# Patient Record
Sex: Male | Born: 1955 | Race: White | Hispanic: No | Marital: Married | Smoking: Former smoker
Health system: Southern US, Community
[De-identification: ages and names within clinical notes are randomized; demographics above are authoritative.]

## PROBLEM LIST (undated history)

## (undated) DIAGNOSIS — F329 Major depressive disorder, single episode, unspecified: Secondary | ICD-10-CM

## (undated) DIAGNOSIS — F32A Depression, unspecified: Secondary | ICD-10-CM

## (undated) DIAGNOSIS — H9190 Unspecified hearing loss, unspecified ear: Secondary | ICD-10-CM

## (undated) DIAGNOSIS — M459 Ankylosing spondylitis of unspecified sites in spine: Secondary | ICD-10-CM

## (undated) DIAGNOSIS — I10 Essential (primary) hypertension: Secondary | ICD-10-CM

## (undated) DIAGNOSIS — R7303 Prediabetes: Secondary | ICD-10-CM

## (undated) DIAGNOSIS — E876 Hypokalemia: Secondary | ICD-10-CM

## (undated) DIAGNOSIS — M069 Rheumatoid arthritis, unspecified: Secondary | ICD-10-CM

## (undated) DIAGNOSIS — K219 Gastro-esophageal reflux disease without esophagitis: Secondary | ICD-10-CM

## (undated) DIAGNOSIS — H547 Unspecified visual loss: Secondary | ICD-10-CM

## (undated) DIAGNOSIS — F1021 Alcohol dependence, in remission: Secondary | ICD-10-CM

## (undated) HISTORY — DX: Alcohol dependence, in remission: F10.21

## (undated) HISTORY — DX: Prediabetes: R73.03

## (undated) HISTORY — DX: Major depressive disorder, single episode, unspecified: F32.9

## (undated) HISTORY — DX: Hypokalemia: E87.6

## (undated) HISTORY — DX: Ankylosing spondylitis of unspecified sites in spine: M45.9

## (undated) HISTORY — DX: Depression, unspecified: F32.A

## (undated) HISTORY — DX: Unspecified hearing loss, unspecified ear: H91.90

## (undated) HISTORY — DX: Rheumatoid arthritis, unspecified: M06.9

## (undated) HISTORY — DX: Unspecified visual loss: H54.7

## (undated) HISTORY — DX: Essential (primary) hypertension: I10

---

## 2004-07-02 ENCOUNTER — Ambulatory Visit: Payer: Self-pay | Admitting: Psychology

## 2004-07-08 ENCOUNTER — Ambulatory Visit: Payer: Self-pay | Admitting: Psychology

## 2004-07-16 ENCOUNTER — Ambulatory Visit: Payer: Self-pay | Admitting: Psychology

## 2004-07-24 ENCOUNTER — Ambulatory Visit: Payer: Self-pay | Admitting: Psychology

## 2004-07-30 ENCOUNTER — Ambulatory Visit: Payer: Self-pay | Admitting: Psychology

## 2004-08-05 ENCOUNTER — Ambulatory Visit: Payer: Self-pay | Admitting: Psychology

## 2004-08-07 ENCOUNTER — Ambulatory Visit: Payer: Self-pay | Admitting: Psychology

## 2004-08-28 ENCOUNTER — Ambulatory Visit: Payer: Self-pay | Admitting: Psychology

## 2004-09-03 ENCOUNTER — Ambulatory Visit: Payer: Self-pay | Admitting: Psychology

## 2004-09-17 ENCOUNTER — Ambulatory Visit: Payer: Self-pay | Admitting: Psychology

## 2004-10-10 ENCOUNTER — Ambulatory Visit: Payer: Self-pay | Admitting: Psychology

## 2004-10-15 ENCOUNTER — Ambulatory Visit: Payer: Self-pay | Admitting: Psychology

## 2004-10-29 ENCOUNTER — Ambulatory Visit: Payer: Self-pay | Admitting: Psychology

## 2004-11-12 ENCOUNTER — Ambulatory Visit: Payer: Self-pay | Admitting: Psychology

## 2004-11-26 ENCOUNTER — Ambulatory Visit: Payer: Self-pay | Admitting: Psychology

## 2004-12-10 ENCOUNTER — Ambulatory Visit: Payer: Self-pay | Admitting: Psychology

## 2004-12-24 ENCOUNTER — Ambulatory Visit: Payer: Self-pay | Admitting: Psychology

## 2005-01-21 ENCOUNTER — Ambulatory Visit: Payer: Self-pay | Admitting: Psychology

## 2005-03-18 ENCOUNTER — Ambulatory Visit: Payer: Self-pay | Admitting: Psychology

## 2005-04-01 ENCOUNTER — Ambulatory Visit: Payer: Self-pay | Admitting: Psychology

## 2005-04-29 ENCOUNTER — Ambulatory Visit: Payer: Self-pay | Admitting: Psychology

## 2005-05-27 ENCOUNTER — Ambulatory Visit: Payer: Self-pay | Admitting: Psychology

## 2005-06-10 ENCOUNTER — Ambulatory Visit: Payer: Self-pay | Admitting: Psychology

## 2005-07-08 ENCOUNTER — Ambulatory Visit: Payer: Self-pay | Admitting: Psychology

## 2005-07-22 ENCOUNTER — Ambulatory Visit: Payer: Self-pay | Admitting: Psychology

## 2005-08-19 ENCOUNTER — Ambulatory Visit: Payer: Self-pay | Admitting: Psychology

## 2005-09-30 ENCOUNTER — Ambulatory Visit: Payer: Self-pay | Admitting: Psychology

## 2005-10-14 ENCOUNTER — Ambulatory Visit: Payer: Self-pay | Admitting: Psychology

## 2005-11-06 ENCOUNTER — Ambulatory Visit: Payer: Self-pay | Admitting: Psychology

## 2005-11-25 ENCOUNTER — Ambulatory Visit: Payer: Self-pay | Admitting: Psychology

## 2005-12-01 ENCOUNTER — Ambulatory Visit: Payer: Self-pay | Admitting: Psychology

## 2005-12-09 ENCOUNTER — Ambulatory Visit: Payer: Self-pay | Admitting: Psychology

## 2005-12-23 ENCOUNTER — Ambulatory Visit: Payer: Self-pay | Admitting: Psychology

## 2006-01-06 ENCOUNTER — Ambulatory Visit: Payer: Self-pay | Admitting: Psychology

## 2006-01-20 ENCOUNTER — Ambulatory Visit: Payer: Self-pay | Admitting: Psychology

## 2006-04-08 ENCOUNTER — Ambulatory Visit: Payer: Self-pay | Admitting: Psychology

## 2006-04-29 ENCOUNTER — Ambulatory Visit: Payer: Self-pay | Admitting: Psychology

## 2006-05-06 ENCOUNTER — Ambulatory Visit: Payer: Self-pay | Admitting: Psychology

## 2006-05-20 ENCOUNTER — Ambulatory Visit: Payer: Self-pay | Admitting: Psychology

## 2006-06-17 ENCOUNTER — Ambulatory Visit: Payer: Self-pay | Admitting: Psychology

## 2006-07-29 ENCOUNTER — Ambulatory Visit: Payer: Self-pay | Admitting: Psychology

## 2006-08-04 ENCOUNTER — Ambulatory Visit: Payer: Self-pay | Admitting: Psychology

## 2006-08-12 ENCOUNTER — Ambulatory Visit: Payer: Self-pay | Admitting: Psychology

## 2006-08-27 ENCOUNTER — Ambulatory Visit: Payer: Self-pay | Admitting: Psychology

## 2006-09-10 ENCOUNTER — Ambulatory Visit: Payer: Self-pay | Admitting: Psychology

## 2006-11-04 ENCOUNTER — Ambulatory Visit: Payer: Self-pay | Admitting: Psychology

## 2006-11-11 ENCOUNTER — Encounter: Payer: Self-pay | Admitting: Emergency Medicine

## 2006-11-12 ENCOUNTER — Inpatient Hospital Stay (HOSPITAL_COMMUNITY): Admission: EM | Admit: 2006-11-12 | Discharge: 2006-11-14 | Payer: Self-pay | Admitting: Internal Medicine

## 2007-06-14 ENCOUNTER — Telehealth: Payer: Self-pay | Admitting: Gastroenterology

## 2007-06-15 ENCOUNTER — Ambulatory Visit: Payer: Self-pay | Admitting: Gastroenterology

## 2007-06-25 ENCOUNTER — Ambulatory Visit: Payer: Self-pay | Admitting: Gastroenterology

## 2008-12-28 ENCOUNTER — Ambulatory Visit: Payer: Self-pay | Admitting: Gastroenterology

## 2009-01-24 ENCOUNTER — Ambulatory Visit: Payer: Self-pay | Admitting: Gastroenterology

## 2010-03-19 NOTE — Procedures (Signed)
Summary: Flexible Sigmoidoscopy  Patient: Barth Trella Note: All result statuses are Final unless otherwise noted.  Tests: (1) Flexible Sigmoidoscopy (FLX)  FLX Flexible Sigmoidoscopy                             DONE     [facility]     [address]     [place], [place]  [address]           FLEXIBLE SIGMOIDOSCOPY PROCEDURE REPORT           PATIENT:  Michaelanthony, Kempton  MR#:  [id number]     BIRTHDATE:  1955-10-11, 53 yrs. old  GENDER:  male     ENDOSCOPIST:  Rachael Fee, MD     [facility] DATE:  01/24/2009     PROCEDURE:  Flexible Sigmoidoscopy with biopsy     ASA CLASS:  Class II     INDICATIONS:  intermittent rectal bleeding for past 6 months (full     colonoscopy in 2009, no polyps, recall at 10 year interval     MEDICATIONS:   none           DESCRIPTION OF PROCEDURE:   After the risks benefits and     alternatives of the procedure were thoroughly explained, informed     consent was obtained.  Digital rectal exam was performed and     revealed no rectal masses.   The [id number] endoscope     was introduced through the anus and advanced to the splenic     flexure, without limitations.  The quality of the prep was good.     The instrument was then slowly withdrawn as the mucosa was fully     examined.     <<PROCEDUREIMAGES>>           There were 3-4 thin linear erosions in rectum. Underlying rectal     mucosa was slightly erythematous but not overtly inflammed     otherwise. Biopsies taken to check for chronic inflammation (see     image1, image3, and image4).  Internal hemorrhoids were found.     These were small and not thrombosed. No fissures noted (see     image5).  The examination was otherwise normal (see image2).     Retroflexed views in the rectum revealed no abnormalities.    The     scope was then withdrawn from the patient and the procedure     terminated.     COMPLICATIONS:  None           ENDOSCOPIC IMPRESSION:     1) Several thin linear  erosions in rectum (chronic, mild     proctitis?), biopsied.     2) Internal hemorrhoids, small and not thrombosed.     3) Otherwise normal examination.  No cancer, no fissures.           RECOMMENDATIONS:     If chronic inflammation is noted on biopsies, will start     mesalamine suppositories for very limited proctitis.  If no     chronic inflammation changes are noted, will treat as symptomatic     (bleeding, uncomfortble) internal hemorrhoids.           ______________________________     Rachael Fee, MD           n.     eSIGNED:   Rachael Fee at 01/24/2009 11:41 AM  [place], [place], [id number]  Note: An exclamation mark (!) indicates a result that was not dispersed into the flowsheet. Document Creation Date: 01/24/2009 11:40 AM _______________________________________________________________________  (1) Order result status: Final Collection or observation date-time: 01/24/2009 11:32 Requested date-time:  Receipt date-time:  Reported date-time:  Referring Physician:   Ordering Physician: Rob Bunting [phone]) Specimen Source:  Source: Launa Grill Order Number: [id number] Lab site:

## 2010-03-19 NOTE — Procedures (Signed)
Summary: Colonoscopy   Colonoscopy  Procedure date:  06/25/2007  Findings:      Location:  [facility].    Procedures Next Due Date:    Colonoscopy: 06/2017  Patient Name: George Higgins, George Higgins MRN:  Procedure Procedures: Colonoscopy CPT: 69629.  Personnel: Endoscopist: Rachael Fee, MD.  Exam Location: Exam performed in Endoscopy Suite. Outpatient  Patient Consent: Procedure, Alternatives, Risks and Benefits discussed, consent obtained, from patient. Consent was obtained by the RN.  Indications  Average Risk Screening Routine.  Comments: years of mild fecal incontinence following a BM (has to clean self a second time, usually after every BM) History  Current Medications: Patient is not currently taking Coumadin.  Comments: Patient history reviewed and updated, pre-procedure physical performed prior to initiation of sedation? yes Pre-Exam Physical: Performed Jun 25, 2007. Cardio-pulmonary exam, Abdominal exam, Mental status exam WNL.  Exam Exam: Extent of exam reached: Terminal Ileum, extent intended: Terminal Ileum.  The cecum was identified by appendiceal orifice and IC valve. Patient position: on left side. Time to Cecum: 00:02:35. Time for Withdrawl: 00:07:46. Colon retroflexion performed. Images taken. ASA Classification: II. Tolerance: good.  Monitoring: Pulse and BP monitoring, Oximetry used. Supplemental O2 given.  Colon Prep Prep results: good.  Sedation Meds: Patient assessed and found to be appropriate for moderate (conscious) sedation. Fentanyl 75 mcg. given IV. Versed 7 mg. given IV.  Findings NORMAL EXAM: Ileum.  - NORMAL EXAM: Cecum to Rectum. Comments: OTHERWISE NORMAL EXAMINATION.  OTHER FINDING: Anus. Comments: SLIGHTLY IRRITATED PERIANAL SKIN (2- 3CM AROUND ANUS).   Assessment Abnormal examination, see findings above.  Comments: SLIGHT PERIANAL SKIN IRRITATION, OTHERWISE THE EXAMINATION WAS NORMAL TO THE TERMINAL ILEUM.   NO POYLPS, NO CANCERS.  HE IS AT ROUTINE RISK FOR COLON CANCER AND SO SHOULD CONTINUE TO FOLLOW CURRENT COLORECTAL CANCER SCREENING GUIDELINES WITH A REPEAT COLONOSCOPY IN 10 YEARS.  HE WILL REPLACE HIS CURRENT CREAM WITH DESITIN CREAM, APPLIED ONCE DAILY TO SEE IF THE IRRITATION IMPROVES.  HE WILL TRY DAILY FIBER SUPPLEMENT (ORANGE POWDER CITRUCEL) TO ATTEMPT TO BULK STOOL AND WILL GET IN TOUCH WITH ME WITH THE RESULTS.  CHERYL (MY RN, DIRECT NUMBER [phone]) OR ME, PAGER [phone]. Events  Unplanned Interventions: No intervention was required.  Unplanned Events: There were no complications. Plans Comments: COLONOSCOPY IN 10 YEARS   This report was created from the original endoscopy report, which was reviewed and signed the above listed endoscopist.

## 2010-03-19 NOTE — Progress Notes (Signed)
Summary: returning nurse call, needs to schedule endo/colon  Phone Note Call from Patient Call back at Work Phone [phone]   Call For: DR JACOBS Reason for Call: Talk to Nurse Summary of Call: Returning call from Dr Christella Hartigan nurse from friday 4/24, please call pt back [phone] to schedule endo & colon;no chart Initial call taken by: Verdell Face,  June 14, 2007 9:43 AM  Follow-up for Phone Call        pt scheduled  for ecl on 06/25/07 at [facility]/given pv on 06/15/07 at 4:30p.m. Follow-up by: Teryl Lucy RN,  June 14, 2007 11:48 AM

## 2010-03-19 NOTE — Miscellaneous (Signed)
Summary: [facility] Previsit/prep  Clinical Lists Changes  Observations: Added new observation of NKA: T (12/28/2008 11:07)

## 2010-03-19 NOTE — Procedures (Signed)
Summary: EGD   EGD  Procedure date:  06/25/2007  Findings:      Location: [facility]    Patient Name: George Higgins, George Higgins MRN:  Procedure Procedures: Panendoscopy (EGD) CPT: 43235.  Personnel: Endoscopist: Rachael Fee, MD.  Exam Location: Exam performed in Endoscopy Suite. Outpatient  Patient Consent: Procedure, Alternatives, Risks and Benefits discussed, consent obtained, from patient. Consent was obtained by the RN.  Indications Symptoms: Reflux symptoms  History  Current Medications: Patient is not currently taking Coumadin.  Comments: Patient history reviewed and updated, pre-procedure physical performed prior to initiation of sedation? YES Pre-Exam Physical: Performed Jun 25, 2007  Cardio-pulmonary exam, Abdominal exam, Mental status exam WNL.  Exam Exam Info: Maximum depth of insertion Duodenum, intended Duodenum. Patient position: on left side. Gastric retroflexion performed. Images taken. ASA Classification: II. Tolerance: good.  Sedation Meds: Patient assessed and found to be appropriate for moderate (conscious) sedation. Fentanyl 25 mcg. given IV. Versed 3 mg. given IV. Cetacaine Spray 2 sprays given aerosolized.  Monitoring: BP and pulse monitoring done. Oximetry used. Supplemental O2 given  Findings - Normal: Proximal Esophagus to Duodenal 2nd Portion.   Assessment Normal examination.  Comments: CHRONIC GERD SYMPTOMS, HOWEVER NO ESOPHAGEAL DAMAGE FROM GERD (NO BARRETT'S, NO ESOPHAGITIS, NO PEPTIC STRICTURING).  HIS SYMPTOMS ARE JUST BARELY CONTROLLED ON ONCE DAILY H2 BLOCKER.  HE KNOWS THAT IF THIS DOESN'T CONTINUE TO HELP HE SHOULD RETRY PPI (OVER THE COUNTER PRILOSEC) AND THAT IT IS BETTER TO TAKE THIS CLASS OF MEDICINES 20- 30 MIN PRIOR TO A DECENT MEAL (USUALLY BREAKFAST).  HE WILL CONTACT ME OR MY OFFICE WITH FURTHER QUESTIONS OR CONCERNS. Events  Unplanned Intervention: No unplanned interventions were required.  Unplanned  Events: There were no complications.    This report was created from the original endoscopy report, which was reviewed and signed the above listed endoscopist.

## 2010-03-19 NOTE — Letter (Signed)
Summary: [facility]  [address] [place], [place] [address]   Phone: [phone]  Fax: [phone]       George Higgins    1955-12-16    MRN: [id number]        Procedure Day Dorna Bloom:  Wednesday 01/24/09     Arrival Time:  10:30a.m.      Procedure Time:  11:30a.m.     Location of Procedure:                    _ X_  [facility] (4th Floor) _ _  [facility] ( Outpatient Registration)    _ _  [facility] ( Short Stay on [facility])   PREPARATION FOR FLEXIBLE SIGMOIDOSCOPY WITH MAGNESIUM CITRATE  Prior to the day before your procedure, purchase one 8 oz. bottle of Magnesium Citrate and one Fleet Enema from the laxative section of your drugstore.  _________________________________________________________________________________________________  THE DAY BEFORE YOUR PROCEDURE             DATE: 01/23/09      DAY: Tuesday  1.   Have a clear liquid dinner the night before your procedure.  2.   Do not drink anything colored red or purple.  Avoid juices with pulp.  No orange juice.              CLEAR LIQUIDS INCLUDE: Water Jello Ice Popsicles Tea (sugar ok, no milk/cream) Powdered fruit flavored drinks Coffee (sugar ok, no milk/cream) Gatorade Juice: apple, white grape, white cranberry  Lemonade Clear bullion, consomm, broth Carbonated beverages (any kind) Strained chicken noodle soup Hard Candy   3.   At 7:00 pm the night before your procedure, drink one bottle of Magnesium Citrate over ice.  4.   Drink at least 3 more glasses of clear liquids before bedtime (preferably juices).  5.   Results are expected usually within 1 to 6 hours after taking the Magnesium Citrate.  ___________________________________________________________________________________________________  THE DAY OF YOUR PROCEDURE            DATE: 01/24/09     DAY: Wednesday  1.   Use Fleet Enema one hour prior to coming for procedure.  2.    You may drink clear liquids until  9:30a.m. (2 hours before exam)       MEDICATION INSTRUCTIONS  Unless otherwise instructed, you should take regular prescription medications with a small sip of water as early as possible the morning of your procedure.  Diabetic patients - see separate instructions.  Stop taking  Plavix or Aggrenox on  _  _  (7 days before procedure).     Stop taking Coumadin on  _  _  (5 days before procedure).  Additional medication instructions:  Hold Tenoretic the morning of procedure.         OTHER INSTRUCTIONS  You will need a responsible adult at least 56 years of age to accompany you and drive you home.   This person must remain in the waiting room during your procedure.  Wear loose fitting clothing that is easily removed.  Leave jewelry and other valuables at home.  However, you may wish to bring a book to read or an iPod/MP3 player to listen to music as you wait for your procedure to start.  Remove all body piercing jewelry and leave at home.  Total time from sign-in until discharge is approximately 2-3 hours.  You should go home directly after your  procedure and rest.  You can resume normal activities the day after your procedure.  The day of your procedure you should not:   Drive   Make legal decisions   Operate machinery   Drink alcohol   Return to work  You will receive specific instructions about eating, activities and medications before you leave.   The above instructions have been reviewed and explained to me by   _______________________    I fully understand and can verbalize these instructions _____________________________ Date _________

## 2010-03-19 NOTE — Miscellaneous (Signed)
Summary: GI Previsit  Clinical Lists Changes  Medications: Added new medication of MOVIPREP 100 GM  SOLR (PEG-KCL-NACL-NASULF-NA ASC-C) As per prep instructions. - Signed Rx of MOVIPREP 100 GM  SOLR (PEG-KCL-NACL-NASULF-NA ASC-C) As per prep instructions.;  #1 x 0;  Signed;  Entered by: Eual Fines RN;  Authorized by: Rachael Fee MD;  Method used: Electronic Observations: Added new observation of NKA: T (06/15/2007 17:06)    Prescriptions: MOVIPREP 100 GM  SOLR (PEG-KCL-NACL-NASULF-NA ASC-C) As per prep instructions.  #1 x 0   Entered by:   Eual Fines RN   Authorized by:   Rachael Fee MD   Signed by:   Eual Fines RN on 06/15/2007   Method used:   Electronically sent to ...       [facility]  [place] [address]       [address]       [place], [place]  [address]       Ph: [phone] or [phone]       Fax: [phone]   RxID:   [id number]

## 2010-07-02 NOTE — H&P (Signed)
NAME:  George Higgins, George Higgins NO.:  [id number]   MEDICAL RECORD NO.:  [id number]          PATIENT TYPE:  EMS   LOCATION:  ED                           FACILITY:  [facility]   PHYSICIAN:  Hillery Aldo, M.D.   DATE OF BIRTH:  February 11, 1956   DATE OF ADMISSION:  11/11/2006  DATE OF DISCHARGE:                              HISTORY & PHYSICAL   PRIMARY CARE PHYSICIAN:  Dr. Raquel James with [facility]  please send a copy of this admission H&P to Dr. Christena Flake office.   CHIEF COMPLAINT:  Altered mental status.   HISTORY OF PRESENT ILLNESS:  This is a 56 year old male with past  medical history of alcohol dependency, who was planning to go to a  rehabilitation program on November 12, 2006.  According to the  patient's wife, he has been on a drinking binge for the past three days.  He has been living separately from his wife in an apartment and she  found him tonight drinking vodka and requesting to go to rehabilitation.  She transported him by a private vehicle to the emergency department and  he was subsequently unable to ambulate by himself into the hospital.  He  is being admitted for acute detoxification and stabilization.   PAST MEDICAL HISTORY:  1. Alcohol dependency.  2. Depression.  3. Dyslipidemia.  4. Hypertension.   FAMILY HISTORY:  The patient's father died in his 61s from an MI.  Mother is alive in her 17s and has diabetes.  He has one brother who  committed suicide and also had alcohol dependency.  He has another  brother who is hypertensive but healthy.   SOCIAL HISTORY:  The patient is married but recently separated.  He has  an anesthesiologist.  He has a history of heavy alcohol use with binge  drinking.  No drug abuse.   ALLERGIES:  NO KNOWN DRUG ALLERGIES.   MEDICATIONS:  1. Lexapro 20 mg daily.  2. Nexium 40 mg daily.  3. Tenoretic 100/25 daily.   REVIEW OF SYSTEMS:  The patient reports that he is not currently  nauseated and has not  had any vomiting.  He has had some headache.  According to his wife, he has been complaining of dyspnea on exertion.  Limited review of systems due to altered mental status.   PHYSICAL EXAMINATION:  VITAL SIGNS:  Temperature 97.8, pulse 71,  respirations 17, blood pressure 118/66, O2 saturation 100% on 2 L.  GENERAL:  Well-developed, well-nourished male in no acute distress.  HEENT::  Normocephalic, atraumatic.  Pupils are dilated to 4 mm but  equally responsive to light.  Extraocular movements are intact.  Oropharynx is clear.  NECK:  Supple, no thyromegaly, no lymphadenopathy, no jugular venous  distention.  CHEST:  Lungs clear to auscultation bilaterally with good air movement.  HEART:  Regular rate and rhythm.  No murmurs, rubs or gallops.  ABDOMEN:  Abdomen:  Soft, nontender, nondistended with normoactive bowel  sounds.  EXTREMITIES:  No clubbing, edema, cyanosis.  SKIN:  Warm and dry.  No rashes.  NEUROLOGIC:  The patient is disoriented to place.  He  reorients easily  and is somewhat lethargic but responsive to stimuli.   DATA REVIEW:  1. A CT scan of the head shows mild atrophy and small vessel ischemic      changes.  No acute changes.  2. Laboratory data:  White blood cell count is 11.8, hemoglobin 15.4,      hematocrit 43.2, platelets 198,000 with an absolute neutrophil      count of 7.7.  Sodium is 135, potassium 2.5, chloride 91, bicarb      25, BUN 9, creatinine 1.01, glucose 107, magnesium 1.9, total      bilirubin 0.7, alkaline phosphatase 38, AST 45, ALT 55, total      protein 6.4, albumin 3.8, calcium 8.5.  No detectable acetaminophen      or salicylate.  Alcohol level was 430.   ASSESSMENT/PLAN:  1. Alcohol dependency with acute intoxication:  We will admit the      patient and monitor him closely on telemetry unit.  We will      administer Ativan p.r.n. for signs of withdrawal or agitation.  We      will put him on seizures precautions.  We will transfer him to  his      detoxification program when he is medically stable, likely      tomorrow.  2. Hypokalemia:  Replete the patient's potassium with a combination of      p.o. and IV potassium.  His magnesium level was normal.  3. History of hypertension:  Monitor the patient's blood pressure.      Given his hypokalemia, will hold the hydrochlorothiazide and      continue the atenolol if his blood pressure and pulse are greater      than 105 and 60 respectively.  4. Prophylaxis:  Use PAS hoses for DVT prophylaxis and put the patient      on Protonix for GI prophylaxis.      Hillery Aldo, M.D.  Electronically Signed     CR/MEDQ  D:  11/11/2006  T:  11/12/2006  Job:  [id number]   cc:   Ursula Beath, MD  Fax: [phone]

## 2010-07-02 NOTE — Discharge Summary (Signed)
NAME:  George Higgins, GERHOLD NO.:  [id number]   MEDICAL RECORD NO.:  [id number]          PATIENT TYPE:  INP   LOCATION:  4729                         FACILITY:  [facility]   PHYSICIAN:  Elliot Cousin, M.D.    DATE OF BIRTH:  [birth year]   DATE OF ADMISSION:  11/12/2006  DATE OF DISCHARGE:  11/14/2006                               DISCHARGE SUMMARY   DISCHARGE DIAGNOSES:  1. Acute alcohol intoxication.  2. Alcohol dependence/alcoholism.  3. Alcohol withdrawal symptoms.  4. Hypokalemia.  5. Hypertension.  6. Depression.  7. Mild hepatic transaminitis secondary to alcohol use.   DISCHARGE MEDICATIONS:  1. Ativan taper, take as directed.  2. Potassium chloride 20 mEq daily.  3. Anusol-HC, apply to affected area as needed.  4. Lexapro 20 mg daily.  5. Tenoretic 50//25 mg daily.  6. Nexium 40 mg daily.  7. Multivitamin daily.   DISCHARGE DISPOSITION:  The patient is currently stable and in improved  condition.  The plan is to discharge him to the [facility], where he will  receive inpatient rehabilitation for alcohol dependency.   CONSULTATIONS:  None   PROCEDURE PERFORMED:  CT scan of the head performed on November 11, 2006.  The results revealed mild premature atrophy without definite  acute intracranial findings.   HISTORY OF PRESENT ILLNESS:  The patient is a 55 year old man with a  past medical history significant for alcohol dependency, hypertension,  and depression.  He presented to the emergency department on November 11, 2006, with a desire to detox from alcohol abuse.  According to the  patient's wife, he had been drinking heavily for the past 3 days leading  up to hospital admission.  He had been living separately from his wife  in an apartment and apparently she found him drinking vodka and  requesting to go to rehabilitation.  When the patient arrived to the  emergency department, he was found to be slightly agitated and  intoxicated and requiring  restraints.  While in the emergency  department, his lab data were significant for an alcohol level of 430  and a serum potassium of 2.5.  His SGOT was noted to be 45 and his SGPT  was 55.  A CT scan of his head was ordered and revealed no acute  intracranial findings.  The patient was admitted for further evaluation  and management.   For additional details please see the dictated history and physical.   HOSPITAL COURSE:  Problem 1.  ALCOHOL DEPENDENCE/ALCOHOL WITHDRAWAL:  As indicated above,  the patient's serum alcohol level was elevated at 430.  An urine drug  screen was ordered for additional evaluation and the results were  negative.  Additionally, his acetaminophen level was less than 10.  The  patient was started on prophylactic treatment with oral Protonix 40 mg  daily.  He was started on IV fluid volume repletion with normal saline  with potassium chloride added.  Symptomatic treatment was started with  Phenergan and Zofran as needed.  To prevent withdrawal symptoms, he was  started on Ativan as needed initially.  However, on hospital day #2  the  patient appeared to be starting to withdraw and therefore the Ativan  protocol was started scheduled instead of p.r.n.  In addition, vitamin  therapy was started with thiamine 100 mg daily and a multivitamin with  iron.   Over the course of the hospitalization, the patient's withdrawal  symptoms completely resolved.  As of today he is currently alert and  oriented.  He has been cooperative  and insightful during the entire  hospitalization.  Per the conversation with the patient and the  patient's wife, the plan is to transfer him to inpatient rehabilitation  at the [facility].  The patient's family will pick him up  today and transport him directly to [facility].  The patient was given a  prescription for Ativan to be tapered over the next few days following  hospital discharge.  The taper is as follows:  Ativan 1 mg  b.i.d. to  complete the q 8 h. course today, then 1 mg b.i.d. tomorrow and then 1  mg once daily on the third day and then stop.  The patient was also  advised to take a multivitamin once daily.   Problem 2.  HYPOKALEMIA:  At the time of hospital admission, the  patient's serum potassium was low at 2.5.  A magnesium level was  assessed and found to be within normal limits at 1.9.  The patient was  repleted with potassium chloride in the IV fluids and orally.  Over the  course of the hospitalization, the hypokalemia completely resolved.  As  of today, his serum potassium is 4.1.  More than likely, the hypokalemia  was secondary to hydrochlorothiazide therapy and alcohol abuse.  The  patient will be maintained on 20 mEq of potassium chloride daily.   Problem 3.  MILD HEPATIC TRANSAMINITIS:  At the time of hospital  admission, the patient's SGOT was 45 and his SGPT was 55.  His total  bilirubin and alkaline phosphatase were within normal limits.  Over the  course of the hospitalization, his transaminitis increased slightly with  an SGOT of 67 and an SGPT of 67.  The patient has had no complaints of  abdominal pain, specifically no complaints of right upper quadrant  abdominal pain.  More than likely, the mild transaminitis is secondary  to alcohol use.   Problem 4.  HYPERTENSION:  The patient was maintained on atenolol  initially.  The hydrochlorothiazide was withheld initially because of  the hypokalemia.  However, once his blood pressure increased with a  diastolic pressure of greater than 100, the hydrochlorothiazide was  restarted.  As of today, his blood pressure is much improved, measuring  128/86.  The patient was advised to continue Tenoretic as previously  prescribed.  As indicated above, he was advised to take potassium  chloride daily.   DISCHARGE LABORATORIES:  Sodium 138, potassium 4.1, chloride 103, CO2  28, glucose 106, BUN 13, creatinine 1.04, total bilirubin 0.6,  alkaline  phosphatase 48, SGOT 67, SGPT 67, total protein 5.5, albumin 3.2,  calcium 9.0.  TSH 2.582.      Elliot Cousin, M.D.  Electronically Signed     DF/MEDQ  D:  11/14/2006  T:  11/14/2006  Job:  [id number]

## 2010-11-28 LAB — MAGNESIUM
Magnesium: 1.8
Magnesium: 1.9

## 2010-11-28 LAB — BASIC METABOLIC PANEL
BUN: 12
BUN: 14
BUN: 9
CO2: 27
CO2: 28
CO2: 30
Calcium: 8.1 — ABNORMAL LOW
Calcium: 8.5
Calcium: 8.9
Chloride: 100
Chloride: 106
Chloride: 98
Creatinine, Ser: 1.1
Creatinine, Ser: 1.14
Creatinine, Ser: 1.16
GFR calc Af Amer: >60
GFR calc Af Amer: >60
GFR calc Af Amer: >60
GFR calc non Af Amer: >60
GFR calc non Af Amer: >60
GFR calc non Af Amer: >60
Glucose, Bld: 131 — ABNORMAL HIGH
Glucose, Bld: 94
Glucose, Bld: 99
Potassium: 2.9 — ABNORMAL LOW
Potassium: 3.8
Potassium: 3.9
Sodium: 138
Sodium: 139
Sodium: 142

## 2010-11-28 LAB — HEPATIC FUNCTION PANEL
ALT: 51
AST: 47 — ABNORMAL HIGH
Albumin: 3.2 — ABNORMAL LOW
Alkaline Phosphatase: 39
Bilirubin, Direct: <0.1
Total Bilirubin: 0.9
Total Protein: 5.4 — ABNORMAL LOW

## 2010-11-28 LAB — COMPREHENSIVE METABOLIC PANEL
ALT: 55 — ABNORMAL HIGH
ALT: 67 — ABNORMAL HIGH
AST: 45 — ABNORMAL HIGH
AST: 67 — ABNORMAL HIGH
Albumin: 3.2 — ABNORMAL LOW
Albumin: 3.8
Alkaline Phosphatase: 38 — ABNORMAL LOW
Alkaline Phosphatase: 48
BUN: 13
BUN: 9
CO2: 25
CO2: 28
Calcium: 8.5
Calcium: 9
Chloride: 103
Chloride: 91 — ABNORMAL LOW
Creatinine, Ser: 1.01
Creatinine, Ser: 1.04
GFR calc Af Amer: >60
GFR calc Af Amer: >60
GFR calc non Af Amer: >60
GFR calc non Af Amer: >60
Glucose, Bld: 106 — ABNORMAL HIGH
Glucose, Bld: 107 — ABNORMAL HIGH
Potassium: 2.5 — CL
Potassium: 4.1
Sodium: 135
Sodium: 138
Total Bilirubin: 0.6
Total Bilirubin: 0.7
Total Protein: 5.5 — ABNORMAL LOW
Total Protein: 6.4

## 2010-11-28 LAB — CBC
HCT: 43.2
Hemoglobin: 15.4
MCHC: 35.5
MCV: 90.7
Platelets: 198
RBC: 4.77
RDW: 15.2 — ABNORMAL HIGH
WBC: 11.8 — ABNORMAL HIGH

## 2010-11-28 LAB — RAPID URINE DRUG SCREEN, HOSP PERFORMED
Amphetamines: NOT DETECTED
Barbiturates: NOT DETECTED
Benzodiazepines: NOT DETECTED
Cocaine: NOT DETECTED
Opiates: NOT DETECTED
Tetrahydrocannabinol: NOT DETECTED

## 2010-11-28 LAB — URINALYSIS, ROUTINE W REFLEX MICROSCOPIC
Bilirubin Urine: NEGATIVE
Glucose, UA: NEGATIVE
Hgb urine dipstick: NEGATIVE
Ketones, ur: NEGATIVE
Nitrite: NEGATIVE
Protein, ur: NEGATIVE
Specific Gravity, Urine: 1.006
Urobilinogen, UA: 0.2
pH: 7.5

## 2010-11-28 LAB — DIFFERENTIAL
Basophils Absolute: 0
Basophils Relative: 0
Eosinophils Absolute: 0
Eosinophils Relative: 0
Lymphocytes Relative: 24
Lymphs Abs: 2.8
Monocytes Absolute: 1.2 — ABNORMAL HIGH
Monocytes Relative: 10
Neutro Abs: 7.7
Neutrophils Relative %: 65

## 2010-11-28 LAB — SALICYLATE LEVEL: Salicylate Lvl: <4

## 2010-11-28 LAB — TSH: TSH: 2.58

## 2010-11-28 LAB — ACETAMINOPHEN LEVEL: Acetaminophen (Tylenol), Serum: <10 — ABNORMAL LOW

## 2010-11-28 LAB — ETHANOL: Alcohol, Ethyl (B): 430

## 2011-05-28 ENCOUNTER — Telehealth: Payer: Self-pay | Admitting: Gastroenterology

## 2011-05-29 NOTE — Telephone Encounter (Signed)
Pt has been scheduled for 06/04/11 he is having abd fullness and loss of appetite.  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  Dr Christella Hartigan has spoken with the pt about being seen.

## 2011-06-03 ENCOUNTER — Other Ambulatory Visit: Payer: Self-pay

## 2011-06-03 ENCOUNTER — Telehealth: Payer: Self-pay | Admitting: Gastroenterology

## 2011-06-03 DIAGNOSIS — K449 Diaphragmatic hernia without obstruction or gangrene: Secondary | ICD-10-CM

## 2011-06-03 DIAGNOSIS — K219 Gastro-esophageal reflux disease without esophagitis: Secondary | ICD-10-CM

## 2011-06-03 NOTE — Telephone Encounter (Signed)
EGD [facility] [id number]  06/05/11 4 pm  UGI barium eval for hiatal hernia  [facility] 06/04/11  930 am arrive 915 am npo midnight   Pt needs to be instructed for his EGD and UGI

## 2011-06-03 NOTE — Telephone Encounter (Signed)
The pt has been instructed for both the EGD and UGI.  His appt for tomorrow with Dr Christella Hartigan has been cx.

## 2011-06-03 NOTE — Telephone Encounter (Signed)
Patty,  I ran into Dr. Leta Jungling at [facility] today.  He is schedule to see me tomorrow AM in the office but we discussed his issues today.  He had a bad episode of severe refluxing.  No dysphagia or vomiting.  He is also interested in finding out about possible hiatal hernia.  Can you call him at cell number [phone], cancel his ROV tomorrow and instead book him for EGD at [facility] this Thursday (for GERD), does not need propofol.  Put him wherever there looks to be space.  Also can you schedule him for barium UGI evaluation (check for hiatal hernia).  Would be nice if this could be done tomorrow.  Thanks

## 2011-06-04 ENCOUNTER — Ambulatory Visit (HOSPITAL_COMMUNITY)
Admission: RE | Admit: 2011-06-04 | Discharge: 2011-06-04 | Disposition: A | Discharge status: Home / Self Care | Payer: BC Managed Care – PPO | Source: Ambulatory Visit | Attending: Gastroenterology | Admitting: Gastroenterology

## 2011-06-04 ENCOUNTER — Ambulatory Visit: Payer: Self-pay | Admitting: Gastroenterology

## 2011-06-04 DIAGNOSIS — K449 Diaphragmatic hernia without obstruction or gangrene: Secondary | ICD-10-CM

## 2011-06-04 DIAGNOSIS — K219 Gastro-esophageal reflux disease without esophagitis: Secondary | ICD-10-CM | POA: Insufficient documentation

## 2011-06-04 DIAGNOSIS — R6881 Early satiety: Secondary | ICD-10-CM | POA: Insufficient documentation

## 2011-06-04 DIAGNOSIS — R0602 Shortness of breath: Secondary | ICD-10-CM | POA: Insufficient documentation

## 2011-06-05 ENCOUNTER — Ambulatory Visit (HOSPITAL_COMMUNITY)
Admission: RE | Admit: 2011-06-05 | Discharge: 2011-06-05 | Disposition: A | Discharge status: Home / Self Care | Payer: BC Managed Care – PPO | Source: Ambulatory Visit | Attending: Gastroenterology | Admitting: Gastroenterology

## 2011-06-05 ENCOUNTER — Encounter (HOSPITAL_COMMUNITY)
Admission: RE | Disposition: A | Discharge status: Home / Self Care | Payer: Self-pay | Source: Ambulatory Visit | Attending: Gastroenterology

## 2011-06-05 ENCOUNTER — Encounter (HOSPITAL_COMMUNITY): Payer: Self-pay | Admitting: *Deleted

## 2011-06-05 DIAGNOSIS — I10 Essential (primary) hypertension: Secondary | ICD-10-CM | POA: Insufficient documentation

## 2011-06-05 DIAGNOSIS — K219 Gastro-esophageal reflux disease without esophagitis: Secondary | ICD-10-CM

## 2011-06-05 HISTORY — DX: Gastro-esophageal reflux disease without esophagitis: K21.9

## 2011-06-05 HISTORY — PX: ESOPHAGOGASTRODUODENOSCOPY: SHX5428

## 2011-06-05 SURGERY — EGD (ESOPHAGOGASTRODUODENOSCOPY)
Anesthesia: Moderate Sedation

## 2011-06-05 MED ORDER — SODIUM CHLORIDE 0.9 % IV SOLN: Freq: Once | INTRAVENOUS | Status: DC

## 2011-06-05 MED ORDER — FENTANYL NICU IV SYRINGE 50 MCG/ML
INJECTION | INTRAMUSCULAR | Status: DC | PRN
  Administered 2011-06-05: 25 ug via INTRAVENOUS
  Administered 2011-06-05 (×2): 20 ug via INTRAVENOUS
  Administered 2011-06-05: 25 ug via INTRAVENOUS

## 2011-06-05 MED ORDER — DIPHENHYDRAMINE HCL 50 MG/ML IJ SOLN
INTRAMUSCULAR | Status: AC
  Filled 2011-06-05: qty 1

## 2011-06-05 MED ORDER — MIDAZOLAM HCL 10 MG/2ML IJ SOLN
INTRAMUSCULAR | Status: AC
  Filled 2011-06-05: qty 4

## 2011-06-05 MED ORDER — FENTANYL CITRATE 0.05 MG/ML IJ SOLN
INTRAMUSCULAR | Status: AC
  Filled 2011-06-05: qty 4

## 2011-06-05 MED ORDER — BUTAMBEN-TETRACAINE-BENZOCAINE 2-2-14 % EX AERO
INHALATION_SPRAY | CUTANEOUS | Status: DC | PRN
  Administered 2011-06-05: 2 via TOPICAL

## 2011-06-05 MED ORDER — MIDAZOLAM HCL 10 MG/2ML IJ SOLN
INTRAMUSCULAR | Status: DC | PRN
  Administered 2011-06-05 (×4): 2 mg via INTRAVENOUS

## 2011-06-05 NOTE — H&P (Signed)
  HPI: This is a very pleasant man with chronic GERD, had a severe episode recently    Past Medical History  Diagnosis Date  . Alcohol dependence   . Hypokalemia   . Hypertension   . Depression   . GERD (gastroesophageal reflux disease)     No past surgical history on file.  Current Facility-Administered Medications  Medication Dose Route Frequency Provider Last Rate Last Dose  . 0.9 %  sodium chloride infusion   Intravenous Once Rachael Fee, MD        Allergies as of 06/03/2011  . (Not on File)    Family History  Problem Relation Age of Onset  . Alcohol abuse    . Heart disease    . Depression      History   Social History  . Marital Status: Married    Spouse Name: N/A    Number of Children: N/A  . Years of Education: N/A   Occupational History  . anesthesiologist    Social History Main Topics  . Smoking status: Not on file  . Smokeless tobacco: Not on file  . Alcohol Use: Not on file  . Drug Use: Not on file  . Sexually Active: Not on file   Other Topics Concern  . Not on file   Social History Narrative  . No narrative on file      Physical Exam: BP 132/93  Temp(Src) 98.2 F (36.8 C) (Oral)  Resp 17  Ht 5' 11.5" (1.816 m)  Wt 200 lb (90.719 kg)  BMI 27.51 kg/m2  SpO2 96% Constitutional: generally well-appearing Psychiatric: alert and oriented x3 Abdomen: soft, nontender, nondistended, no obvious ascites, no peritoneal signs, normal bowel sounds     Assessment and plan: 56 y.o. male with with GERD, worse recently  For EGD today

## 2011-06-05 NOTE — Op Note (Signed)
[facility] [address] [place], [place]  [address]  ENDOSCOPY PROCEDURE REPORT  PATIENT:  George Higgins, George Higgins  MR#:  [id number] BIRTHDATE:  1955-06-11, 55 yrs. old  GENDER:  male ENDOSCOPIST:  Rachael Fee, MD PROCEDURE DATE:  06/05/2011 PROCEDURE:  EGD, diagnostic 43235 ASA CLASS:  Class II INDICATIONS:  GERD, worse lately; early satiety MEDICATIONS:   Fentanyl 90 mcg IV, Versed 8 mg IV TOPICAL ANESTHETIC:  Cetacaine Spray  DESCRIPTION OF PROCEDURE:   After the risks benefits and alternatives of the procedure were thoroughly explained, informed consent was obtained.  The Pentax Gastroscope [id number] endoscope was introduced through the mouth and advanced to the second portion of the duodenum, without limitations.  The instrument was slowly withdrawn as the mucosa was fully examined. <<PROCEDUREIMAGES>> The upper, middle, and distal third of the esophagus were carefully inspected and no abnormalities were noted. The z-line was well seen at the GEJ. The endoscope was pushed into the fundus which was normal including a retroflexed view. The antrum,gastric body, first and second part of the duodenum were unremarkable (see image1, image2, image3, image5, image6, and image7). Retroflexed views revealed no abnormalities.    The scope was then withdrawn from the patient and the procedure completed. COMPLICATIONS:  None  ENDOSCOPIC IMPRESSION: 1) Normal EGD 2) Symptoms likely acid related (non-erosive GERD)  RECOMMENDATIONS: Please begin H2 blocker (pepcid or zantac) nightly at bedtime. Continue PPI (prevacid or prilosec) every AM shortly before breakfast. Call Dr. Christella Hartigan in 4-5 weeks to report on your symptoms.  ______________________________ Rachael Fee, MD  n. eSIGNED:   Rachael Fee at 06/05/2011 01:24 PM  Ronelle Nigh, [phone]

## 2011-06-05 NOTE — Discharge Instructions (Signed)
YOU HAD AN ENDOSCOPIC PROCEDURE TODAY: Refer to the procedure report that was given to you for any specific questions about what was found during the examination.  If the procedure report does not answer your questions, please call your gastroenterologist to clarify.  YOU SHOULD EXPECT: Some feelings of bloating in the abdomen. Passage of more gas than usual.  Walking can help get rid of the air that was put into your GI tract during the procedure and reduce the bloating. If you had a lower endoscopy (such as a colonoscopy or flexible sigmoidoscopy) you may notice spotting of blood in your stool or on the toilet paper.   DIET: Your first meal following the procedure should be a light meal and then it is ok to progress to your normal diet.  A half-sandwich or bowl of soup is an example of a good first meal.  Heavy or fried foods are harder to digest and may make you feel nasueas or bloated.  Drink plenty of fluids but you should avoid alcoholic beverages for 24 hours.  ACTIVITY: Your care partner should take you home directly after the procedure.  You should plan to take it easy, moving slowly for the rest of the day.  You can resume normal activity the day after the procedure however you should NOT DRIVE or use heavy machinery for 24 hours (because of the sedation medicines used during the test).    SYMPTOMS TO REPORT IMMEDIATELY  A gastroenterologist can be reached at any hour.  Please call your doctor's office for any of the following symptoms:   Following lower endoscopy (colonoscopy, flexible sigmoidoscopy)  Excessive amounts of blood in the stool  Significant tenderness, worsening of abdominal pains  Swelling of the abdomen that is new, acute  Fever of 100 or higher  Following upper endoscopy (EGD, EUS, ERCP)  Vomiting of blood or coffee ground material  New, significant abdominal pain  New, significant chest pain or pain under the shoulder blades  Painful or persistently difficult  swallowing  New shortness of breath  Black, tarry-looking stools  FOLLOW UP: Please begin taking once nightly H2 blocker (pepcid or zantac) at bedtime. Continue once daily PPI shortly before breakfast meal. Call Dr. Christella Hartigan to report on your symptoms in 4-5 weeks.

## 2011-06-06 ENCOUNTER — Encounter (HOSPITAL_COMMUNITY): Payer: Self-pay | Admitting: Gastroenterology

## 2011-06-09 ENCOUNTER — Telehealth: Payer: Self-pay

## 2011-06-09 NOTE — Telephone Encounter (Signed)
Message copied by Donata Duff on Mon Jun 09, 2011  9:58 AM ------      Message from: Rob Bunting P      Created: Thu Jun 05, 2011 12:17 PM                   Korby Ratay,            Can you call him sometime next week about getting his son's information. His son needs colonoscopy [facility] in June sometime for FH (his biologic father) of colon cancer.  He is in college in [place] out sometime in may.  Maybe we can get him scheduled for previsit sometime late may and then colonsocoyp in June thanks            dan

## 2011-06-10 NOTE — Telephone Encounter (Signed)
I spoke with Dr Leta Jungling and have the needed information.  I will put a phone note in for his son.

## 2013-01-25 IMAGING — CR DG UGI W/ HIGH DENSITY W/KUB
15 series · 15 of 15 positions shown · non-contrast
Comparison: None.

CLINICAL DATA: Shortness of breath, reflux and early satiety.

UPPER GI SERIES WITH KUB
TECHNIQUE: Routine upper GI series was performed with thin and
high density barium.
Fluoroscopy Time: 0.5 minutes

[run (1 of 14)]
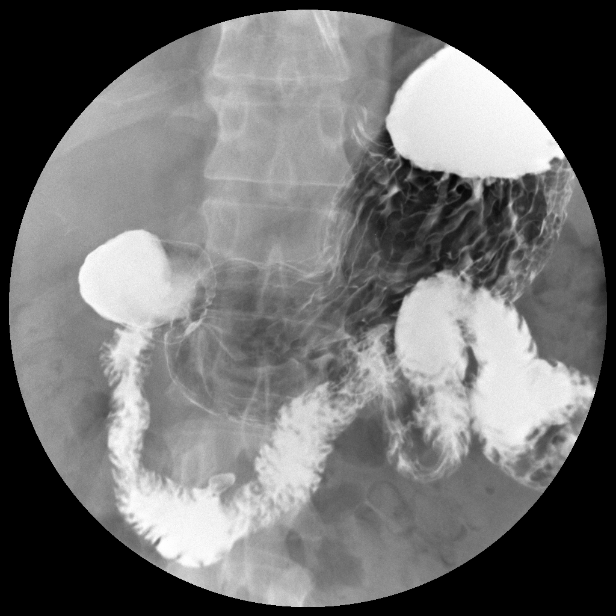

[run (2 of 14)]
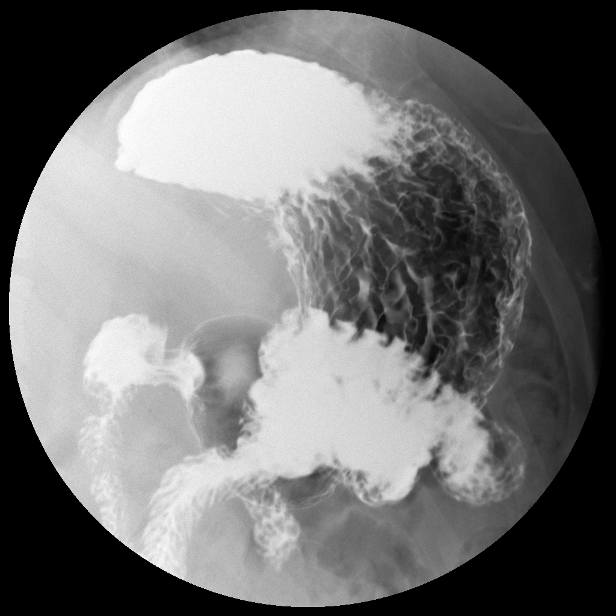

[run (3 of 14)]
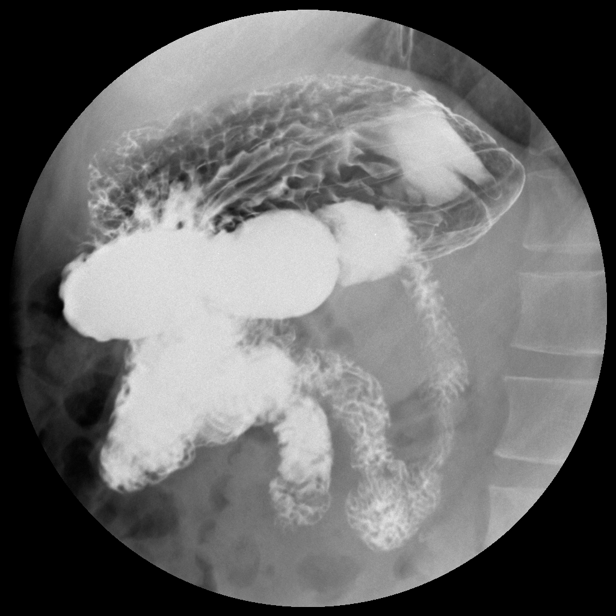

[run (4 of 14)]
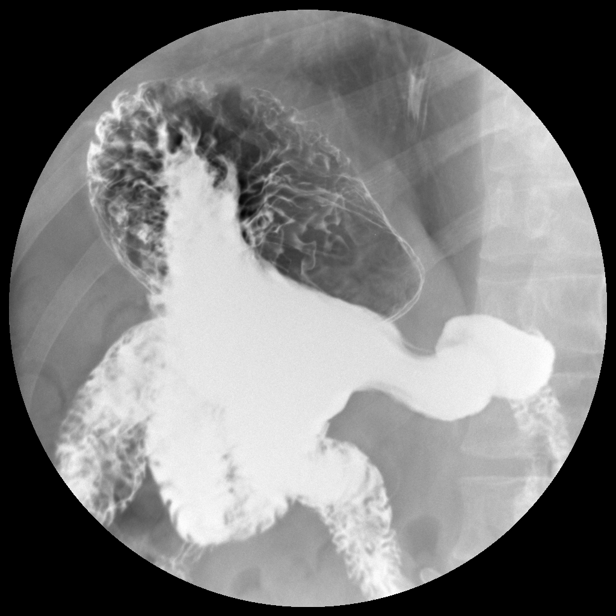

[run (5 of 14)]
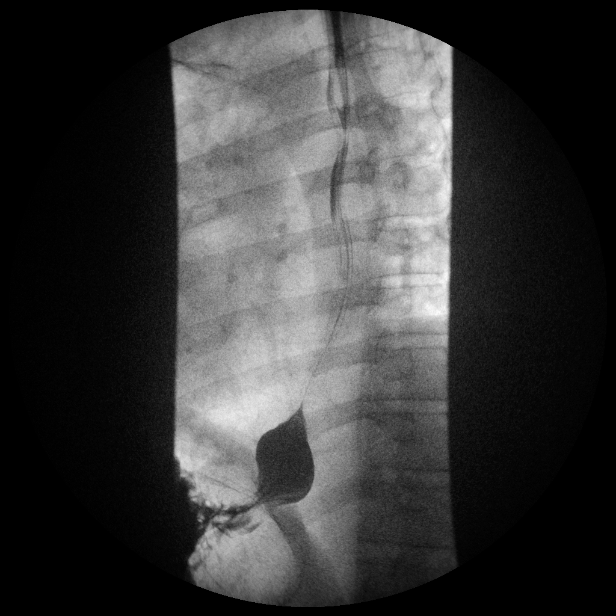

[run (6 of 14)]
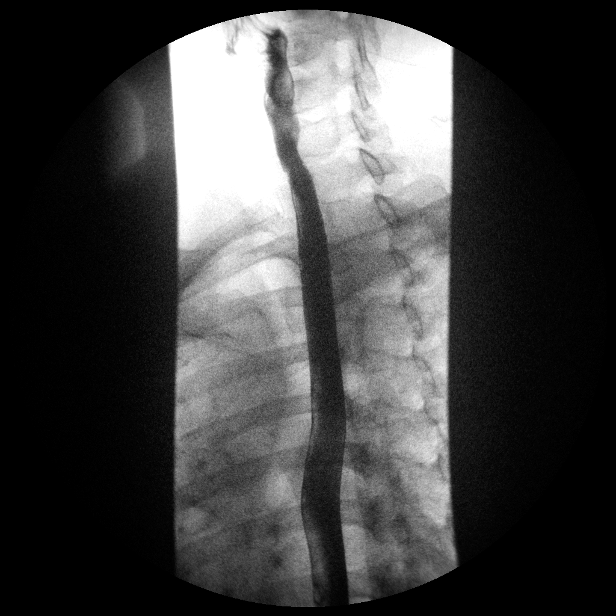

[run (7 of 14)]
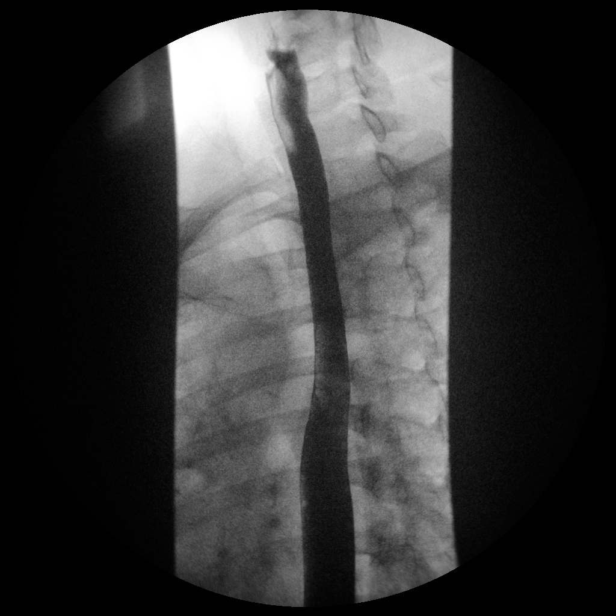

[run (8 of 14)]
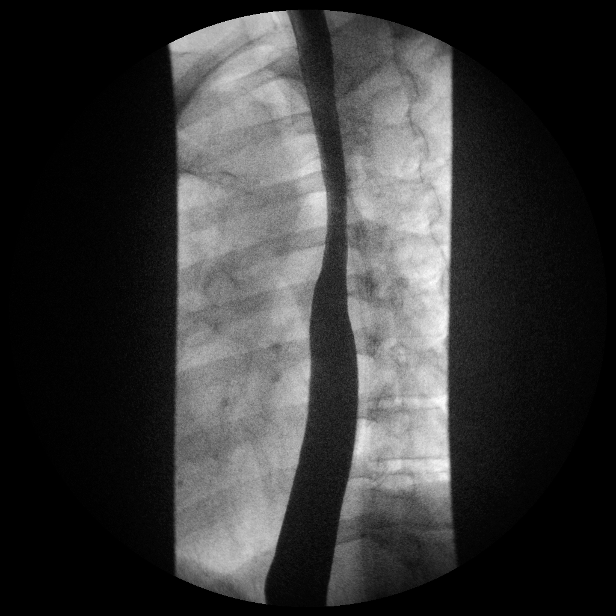

[run (9 of 14)]
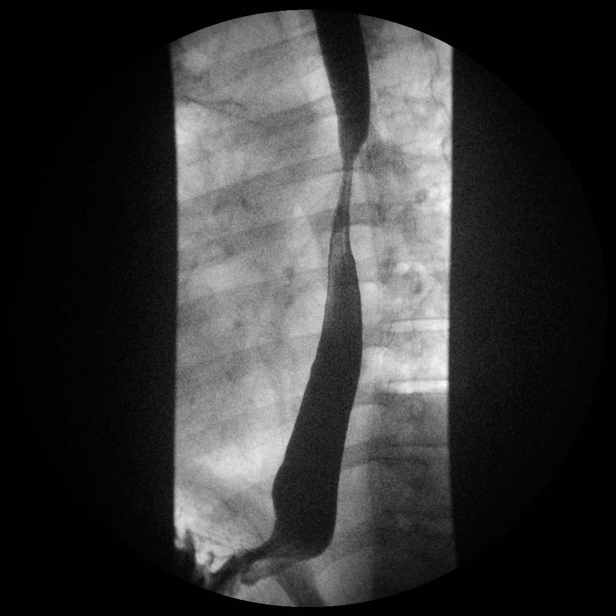

[run (10 of 14)]
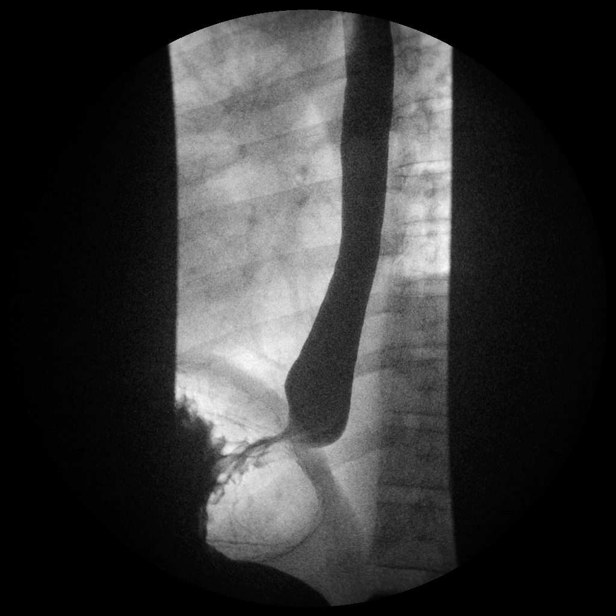

[run (11 of 14)]
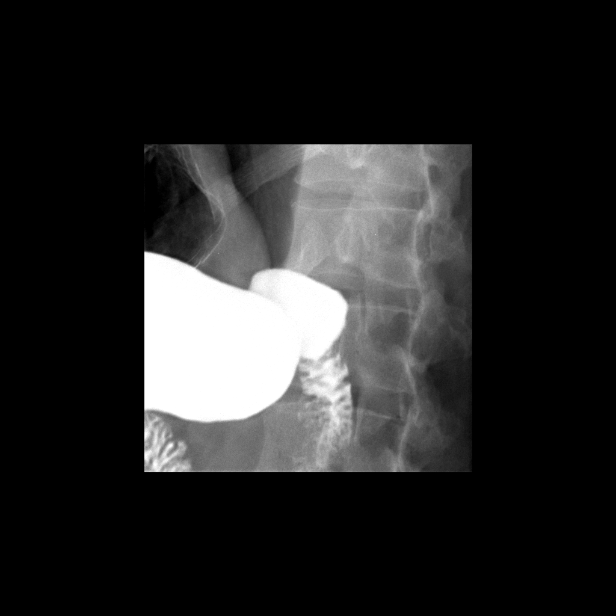

[run (12 of 14)]
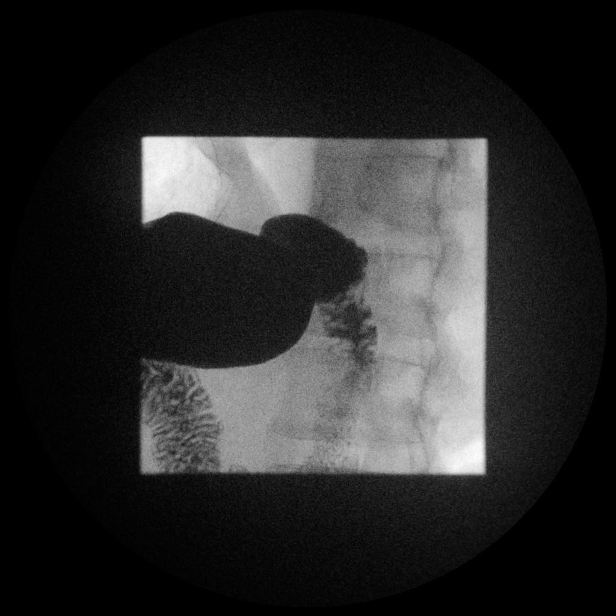

[run (13 of 14)]
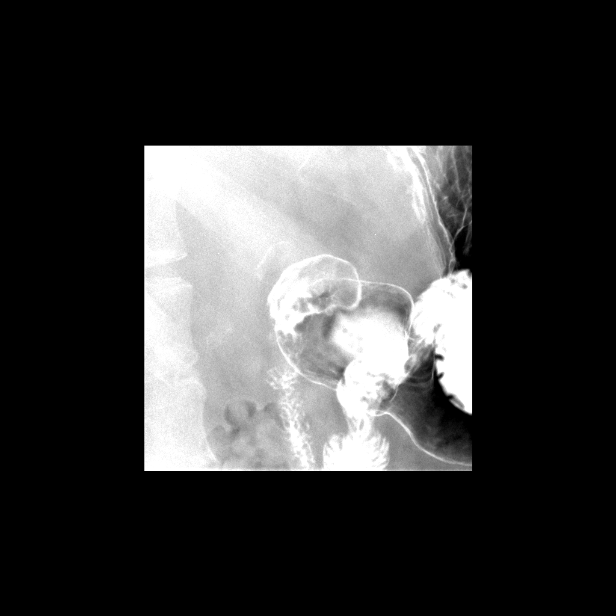

[run (14 of 14)]
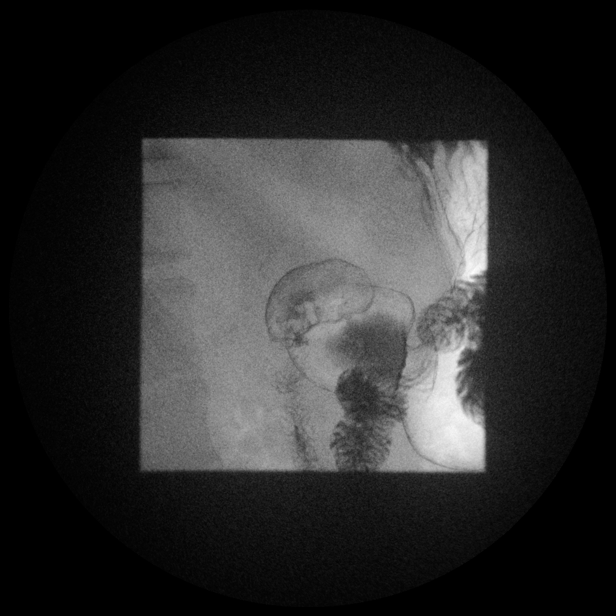

[view not recorded]
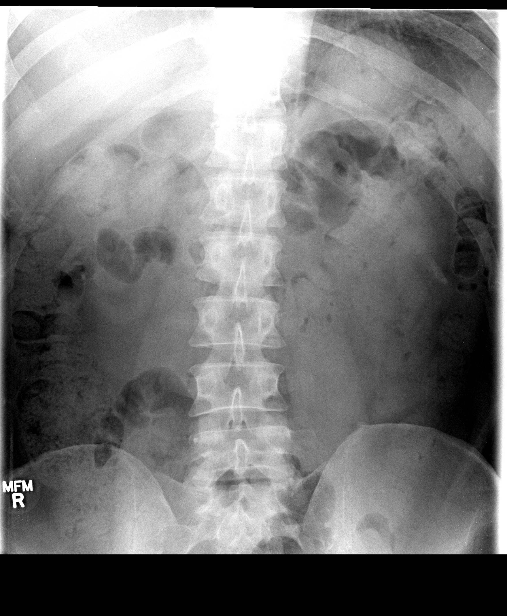

[15 of 15 positions shown; findings below may reference images not displayed]

FINDINGS: Scout view of the abdomen shows a fair amount of stool in
the colon.  No unexpected radiopaque calculi.

Double contrast examination of the upper gastrointestinal tract
shows normal esophageal motility.  No esophageal fold thickening,
stricture or obstruction.  Stomach and duodenal bulb are normal.
IMPRESSION: Normal exam.

## 2014-01-11 ENCOUNTER — Encounter: Payer: Self-pay | Admitting: Gastroenterology

## 2017-06-01 ENCOUNTER — Encounter: Payer: Self-pay | Admitting: Gastroenterology

## 2017-06-15 ENCOUNTER — Encounter: Payer: Self-pay | Admitting: Internal Medicine

## 2017-12-24 ENCOUNTER — Ambulatory Visit (INDEPENDENT_AMBULATORY_CARE_PROVIDER_SITE_OTHER): Payer: BLUE CROSS/BLUE SHIELD | Admitting: Psychiatry

## 2017-12-24 ENCOUNTER — Encounter: Payer: Self-pay | Admitting: Psychiatry

## 2017-12-24 VITALS — BP 141/91 | HR 58

## 2017-12-24 DIAGNOSIS — F418 Other specified anxiety disorders: Secondary | ICD-10-CM

## 2017-12-24 DIAGNOSIS — F1021 Alcohol dependence, in remission: Secondary | ICD-10-CM | POA: Diagnosis not present

## 2017-12-24 DIAGNOSIS — F419 Anxiety disorder, unspecified: Secondary | ICD-10-CM | POA: Diagnosis not present

## 2017-12-24 MED ORDER — BUSPIRONE HCL 5 MG PO TABS: 5.0000 mg | ORAL_TABLET | Freq: Two times a day (BID) | ORAL | 1 refills | Status: AC

## 2017-12-24 MED ORDER — MODAFINIL 200 MG PO TABS: 200.0000 mg | ORAL_TABLET | Freq: Every day | ORAL | 5 refills | Status: DC

## 2017-12-24 NOTE — Progress Notes (Signed)
George Higgins [id number] Sep 02, 1955 62 y.o.  Subjective:   Patient ID:  George Higgins is a 62 y.o. (DOB 04-23-1955) male.  Chief Complaint:  Chief Complaint  Patient presents with  . Follow-up    Anxiety, cognitive issues, h/o ETOH dependence    HPI George Higgins presents to the office today for follow-up of anxiety, cognitive issues, and h/o ETOH use. He reports "my anxiety has gotten less, but my depression has gotten more." He attributes this to losing many of the things that he enjoys, to include hobbies, reading, and dietary changes. He reports that he trying to "find work arounds" to deal with these losses. Denies persistent sad mood and that sad mood "comes in phases." He reports improved anxiety.  He reports that Provigil has been helpful for energy and concentration. He reports that he does not use it daily "but it's great to know that it is there." He reports some mild side effects "but not enough to where I wouldn't take it" but also does not want to take it daily. Reports that Provigil seems to be more effective when he takes it intermittently instead of daily. Reports that he will likely take Provigil today when he leaves apt to help stay alert while driving home. He reports that his memory and concentration are "about the same" and reports that when he presented for appointment he temporarily forgot who he was there to see and then stepped to the side until he was able to recall this information. Energy is persistently low and denies any worsening in energy. He reports that he often feels tired and needs a nap in the afternoons and then feels very groggy afterwards. He reports "I'm a little more motivated" towards some things, and less motivated towards others. He reports adequate sleep. Appetite is ok but decreased compared to the past. Denies SI.   Initiates care with a new PCP next week.  Review of Systems:  Review of Systems  HENT: Positive for hearing loss.        Reports  eyes will burn when he is reading  Musculoskeletal: Positive for arthralgias, back pain, joint swelling, myalgias, neck pain and neck stiffness.  Neurological: Negative for tremors and headaches.    Medications: I have reviewed the patient's current medications.  Current Outpatient Medications  Medication Sig Dispense Refill  . aspirin EC 81 MG tablet Take 81 mg by mouth daily.    . busPIRone (BUSPAR) 5 MG tablet Take 1 tablet (5 mg total) by mouth 2 (two) times daily. 180 tablet 1  . cyclobenzaprine (FLEXERIL) 10 MG tablet Take 10 mg by mouth as needed for muscle spasms.    Marland Kitchen etanercept (ENBREL) 50 MG/ML injection Inject 50 mg into the skin once a week.    . METHOTREXATE SODIUM IJ once a week.    . metoprolol tartrate (LOPRESSOR) 25 MG tablet Take 25 mg by mouth daily.    Derrill Memo ON 02/18/2018] modafinil (PROVIGIL) 200 MG tablet Take 1 tablet (200 mg total) by mouth daily. 1/2 po BID as needed 30 tablet 5  . Multiple Vitamin (MULTIVITAMIN) capsule Take 1 capsule by mouth daily.    Marland Kitchen omega-3 acid ethyl esters (LOVAZA) 1 g capsule Take by mouth 2 (two) times daily.    . ranitidine (ZANTAC) 150 MG capsule Take 150 mg by mouth at bedtime.    Marland Kitchen SILODOSIN PO Take by mouth.    Marland Kitchen omeprazole (PRILOSEC) 10 MG capsule Take 10 mg by mouth daily.    Marland Kitchen  rosuvastatin (CRESTOR) 10 MG tablet Take 10 mg by mouth 2 (two) times a week.     No current facility-administered medications for this visit.     Medication Side Effects: Other: Some mild side effects with Provigl to include nasal congestion and some mild stomach discomfort  Allergies: No Known Allergies  Past Medical History:  Diagnosis Date  . Alcohol dependence in remission ([place])   . Ankylosing spondylitis ([place])   . Depression   . GERD (gastroesophageal reflux disease)   . Hearing loss   . Hypertension   . Hypokalemia   . Rheumatoid arthritis ([place])   . Visual impairment     Family History  Problem Relation Age of Onset  . Alcohol  abuse Unknown   . Heart disease Unknown   . Depression Unknown   . Diabetes Mother   . Hypertension Mother   . Alcohol abuse Mother   . Dementia Mother   . Heart attack Father   . Hypertension Father   . Alcohol abuse Father   . Alcohol abuse Brother   . Suicidality Brother   . Alcohol abuse Maternal Uncle   . Alcohol abuse Maternal Grandfather     Social History   Socioeconomic History  . Marital status: Married    Spouse name: Not on file  . Number of children: Not on file  . Years of education: Not on file  . Highest education level: Not on file  Occupational History  . Occupation: anesthesiologist  Social Needs  . Financial resource strain: Not on file  . Food insecurity:    Worry: Not on file    Inability: Not on file  . Transportation needs:    Medical: Not on file    Non-medical: Not on file  Tobacco Use  . Smoking status: Former Research scientist (life sciences)  . Smokeless tobacco: Never Used  Substance and Sexual Activity  . Alcohol use: Not Currently    Comment: Reports that last ETOH use was 10/16/15  . Drug use: Not on file  . Sexual activity: Not on file  Lifestyle  . Physical activity:    Days per week: Not on file    Minutes per session: Not on file  . Stress: Not on file  Relationships  . Social connections:    Talks on phone: Not on file    Gets together: Not on file    Attends religious service: Not on file    Active member of club or organization: Not on file    Attends meetings of clubs or organizations: Not on file    Relationship status: Not on file  . Intimate partner violence:    Fear of current or ex partner: Not on file    Emotionally abused: Not on file    Physically abused: Not on file    Forced sexual activity: Not on file  Other Topics Concern  . Not on file  Social History Narrative  . Not on file    Past Medical History, Surgical history, Social history, and Family history were reviewed and updated as appropriate.   Please see review of  systems for further details on the patient's review from today.   Objective:   Physical Exam:  BP (!) 141/91   Pulse (!) 58   Physical Exam  Constitutional: He is oriented to person, place, and time. He appears well-developed. No distress.  Musculoskeletal: He exhibits no deformity.  Neurological: He is alert and oriented to person, place, and time. Coordination normal.  Psychiatric:  His speech is normal and behavior is normal. Judgment and thought content normal. His mood appears not anxious. His affect is not angry, not blunt, not labile and not inappropriate. Cognition and memory are normal. He expresses no homicidal and no suicidal ideation. He expresses no suicidal plans and no homicidal plans.  Mood and affect appropriate to content. Insight intact. No auditory or visual hallucinations. No delusions.     Lab Review:     Component Value Date/Time   NA 138 11/14/2006 0405   K 4.1 11/14/2006 0405   CL 103 11/14/2006 0405   CO2 28 11/14/2006 0405   GLUCOSE 106 (H) 11/14/2006 0405   BUN 13 11/14/2006 0405   CREATININE 1.04 11/14/2006 0405   CALCIUM 9.0 11/14/2006 0405   PROT 5.5 (L) 11/14/2006 0405   ALBUMIN 3.2 (L) 11/14/2006 0405   AST 67 (H) 11/14/2006 0405   ALT 67 (H) 11/14/2006 0405   ALKPHOS 48 11/14/2006 0405   BILITOT 0.6 11/14/2006 0405   GFRNONAA >60 11/14/2006 0405   GFRAA  11/14/2006 0405    >60        The eGFR has been calculated using the MDRD equation. This calculation has not been validated in all clinical       Component Value Date/Time   WBC 11.8 (H) 11/11/2006 1847   RBC 4.77 11/11/2006 1847   HGB 15.4 11/11/2006 1847   HCT 43.2 11/11/2006 1847   PLT 198 11/11/2006 1847   MCV 90.7 11/11/2006 1847   MCHC 35.5 11/11/2006 1847   RDW 15.2 (H) 11/11/2006 1847   LYMPHSABS 2.8 11/11/2006 1847   MONOABS 1.2 (H) 11/11/2006 1847   EOSABS 0.0 11/11/2006 1847   BASOSABS 0.0 11/11/2006 1847    No results found for: POCLITH, LITHIUM   No results  found for: PHENYTOIN, PHENOBARB, VALPROATE, CBMZ   .res Assessment: Plan:   Patient seen for 30 minutes and greater than 50% of visit spent counseling patient regarding continuing to use Provigil 200 mg one half tab p.o. twice daily as needed to improve concentration, fatigue, and excessive daytime somnolence.  Discussed that medications can be sent electronically and that future dated prescription would be sent to his pharmacy in [place].  Will continue BuSpar for anxiety.  Patient advised to contact office with any worsening in signs and symptoms. Anxiety disorder, unspecified type - Plan: busPIRone (BUSPAR) 5 MG tablet  Please see After Visit Summary for patient specific instructions.  Future Appointments  Date Time Provider [place]  06/24/2018  1:15 PM [name], PMHNP [facility] None    No orders of the defined types were placed in this encounter.     -------------------------------

## 2018-03-23 ENCOUNTER — Encounter: Payer: Self-pay | Admitting: Emergency Medicine

## 2018-03-23 DIAGNOSIS — F1021 Alcohol dependence, in remission: Secondary | ICD-10-CM | POA: Insufficient documentation

## 2018-06-24 ENCOUNTER — Encounter: Payer: Self-pay | Admitting: Psychiatry

## 2018-06-24 ENCOUNTER — Ambulatory Visit (INDEPENDENT_AMBULATORY_CARE_PROVIDER_SITE_OTHER): Payer: Medicare PPO | Admitting: Psychiatry

## 2018-06-24 ENCOUNTER — Other Ambulatory Visit: Payer: Self-pay

## 2018-06-24 DIAGNOSIS — F419 Anxiety disorder, unspecified: Secondary | ICD-10-CM

## 2018-06-24 MED ORDER — MODAFINIL 200 MG PO TABS: 200.0000 mg | ORAL_TABLET | Freq: Every day | ORAL | 5 refills | Status: DC

## 2018-06-24 NOTE — Progress Notes (Signed)
George Higgins [id number] 01/19/1956 63 y.o.  Virtual Visit via Telephone Note  I connected with pt on 06/24/18 at  1:15 PM EDT by telephone and verified that I am speaking with the correct person using two identifiers.   I discussed the limitations, risks, security and privacy concerns of performing an evaluation and management service by telephone and the availability of in person appointments. I also discussed with the patient that there may be a patient responsible charge related to this service. The patient expressed understanding and agreed to proceed.   I discussed the assessment and treatment plan with the patient. The patient was provided an opportunity to ask questions and all were answered. The patient agreed with the plan and demonstrated an understanding of the instructions.   The patient was advised to call back or seek an in-person evaluation if the symptoms worsen or if the condition fails to improve as anticipated.  I provided 30 minutes of non-face-to-face time during this encounter.  The patient was located at home.  The provider was located at home.   [name], PMHNP   Subjective:   Patient ID:  George Higgins. is a 63 y.o. (DOB 01/19/1956) male.  Chief Complaint:  Chief Complaint  Patient presents with  . Follow-up    h/o anxiety, cognitive issues, and past ETOH misuse    HPI George Higgins. presents for follow-up of anxiety and cognitive issues. He reports that his thoughts about returning to work have ended with pandemic.  Reports that his wife's nephew and wife are expecting a baby and that his wife wants to be involved and help care for their other child. He reports that he and his wife have been trying to determine how they can mitigate his risk of getting COVID 19 considering that he is taking immunosuppressants.   He reports that Provigil has been helpful for his energy and motivation and that he has not been dozing off. He reports "I  think all my anxiety was work related... I don't have the same stress." He reports that for several years he continued to have some fears of having to return to return to work. Reports that he has mainly had situational stress. Reports that he no longer needs Buspar. He reports that his mood has been more "depressed than it was the last 6 months" and attributes this to circumstances. He reports that he has been sleeping well overall. He reports that his appetite has been stable. He reports that he stopped eating beef and pork. He reports that he stopped eating starches several months ago. He reports some weight loss in response to dietary changes. Reports that Provigil has been helpful for concentration and continues to have some difficulty with concentration on occasion. Reports that memory difficulties seem to have stabilized.   Denies SI.   Reports that Provigil 200 mg po q am has been most effective.   He reports that he has been approved for social security disability.   Review of Systems:  Review of Systems  HENT: Positive for hearing loss.   Eyes: Positive for visual disturbance.  Musculoskeletal: Positive for arthralgias and myalgias.  Neurological: Positive for weakness. Negative for tremors.  Psychiatric/Behavioral:       Please refer to HPI    Medications: I have reviewed the patient's current medications.  Current Outpatient Medications  Medication Sig Dispense Refill  . aspirin EC 81 MG tablet Take 81 mg by mouth daily.    Marland Kitchen  cyclobenzaprine (FLEXERIL) 10 MG tablet Take 10 mg by mouth as needed for muscle spasms.    Marland Kitchen etanercept (ENBREL) 50 MG/ML injection Inject 50 mg into the skin once a week.    . famotidine (PEPCID) 40 MG tablet Take 40 mg by mouth daily.    Marland Kitchen levothyroxine (SYNTHROID) 50 MCG tablet Take 50 mcg by mouth daily before breakfast.    . METHOTREXATE SODIUM IJ once a week.    . modafinil (PROVIGIL) 200 MG tablet Take 1 tablet (200 mg total) by mouth daily. 1/2  po BID as needed 30 tablet 5  . OLMESARTAN MEDOXOMIL PO Take 10 mg by mouth.    . omega-3 acid ethyl esters (LOVAZA) 1 g capsule Take by mouth 2 (two) times daily.    . tadalafil (CIALIS) 10 MG tablet Take 10 mg by mouth daily as needed for erectile dysfunction.    . tamsulosin (FLOMAX) 0.4 MG CAPS capsule Take 0.4 mg by mouth daily.    . metoprolol tartrate (LOPRESSOR) 25 MG tablet Take 25 mg by mouth daily.     No current facility-administered medications for this visit.     Medication Side Effects: None  Allergies: No Known Allergies  Past Medical History:  Diagnosis Date  . Alcohol dependence in remission ([place])   . Ankylosing spondylitis ([place])   . Depression   . GERD (gastroesophageal reflux disease)   . Hearing loss   . Hypertension   . Hypokalemia   . Rheumatoid arthritis ([place])   . Visual impairment     Family History  Problem Relation Age of Onset  . Alcohol abuse Unknown   . Heart disease Unknown   . Depression Unknown   . Diabetes Mother   . Hypertension Mother   . Alcohol abuse Mother   . Dementia Mother   . Heart attack Father   . Hypertension Father   . Alcohol abuse Father   . Alcohol abuse Brother   . Suicidality Brother   . Alcohol abuse Maternal Uncle   . Alcohol abuse Maternal Grandfather     Social History   Socioeconomic History  . Marital status: Married    Spouse name: Not on file  . Number of children: Not on file  . Years of education: Not on file  . Highest education level: Not on file  Occupational History  . Occupation: anesthesiologist  Social Needs  . Financial resource strain: Not on file  . Food insecurity:    Worry: Not on file    Inability: Not on file  . Transportation needs:    Medical: Not on file    Non-medical: Not on file  Tobacco Use  . Smoking status: Former Research scientist (life sciences)  . Smokeless tobacco: Never Used  Substance and Sexual Activity  . Alcohol use: Not Currently    Comment: Reports that last ETOH use was 10/16/15   . Drug use: Not on file  . Sexual activity: Not on file  Lifestyle  . Physical activity:    Days per week: Not on file    Minutes per session: Not on file  . Stress: Not on file  Relationships  . Social connections:    Talks on phone: Not on file    Gets together: Not on file    Attends religious service: Not on file    Active member of club or organization: Not on file    Attends meetings of clubs or organizations: Not on file    Relationship status: Not on file  .  Intimate partner violence:    Fear of current or ex partner: Not on file    Emotionally abused: Not on file    Physically abused: Not on file    Forced sexual activity: Not on file  Other Topics Concern  . Not on file  Social History Narrative  . Not on file    Past Medical History, Surgical history, Social history, and Family history were reviewed and updated as appropriate.   Please see review of systems for further details on the patient's review from today.   Objective:   Physical Exam:  There were no vitals taken for this visit.  Physical Exam Neurological:     Mental Status: He is alert and oriented to person, place, and time.     Cranial Nerves: No dysarthria.  Psychiatric:        Attention and Perception: Attention normal.        Mood and Affect: Mood normal.        Speech: Speech normal.        Behavior: Behavior is cooperative.        Thought Content: Thought content normal. Thought content is not paranoid or delusional. Thought content does not include homicidal or suicidal ideation. Thought content does not include homicidal or suicidal plan.        Cognition and Memory: Cognition and memory normal.        Judgment: Judgment normal.     Lab Review:     Component Value Date/Time   NA 138 11/14/2006 0405   K 4.1 11/14/2006 0405   CL 103 11/14/2006 0405   CO2 28 11/14/2006 0405   GLUCOSE 106 (H) 11/14/2006 0405   BUN 13 11/14/2006 0405   CREATININE 1.04 11/14/2006 0405   CALCIUM 9.0  11/14/2006 0405   PROT 5.5 (L) 11/14/2006 0405   ALBUMIN 3.2 (L) 11/14/2006 0405   AST 67 (H) 11/14/2006 0405   ALT 67 (H) 11/14/2006 0405   ALKPHOS 48 11/14/2006 0405   BILITOT 0.6 11/14/2006 0405   GFRNONAA >60 11/14/2006 0405   GFRAA  11/14/2006 0405    >60        The eGFR has been calculated using the MDRD equation. This calculation has not been validated in all clinical       Component Value Date/Time   WBC 11.8 (H) 11/11/2006 1847   RBC 4.77 11/11/2006 1847   HGB 15.4 11/11/2006 1847   HCT 43.2 11/11/2006 1847   PLT 198 11/11/2006 1847   MCV 90.7 11/11/2006 1847   MCHC 35.5 11/11/2006 1847   RDW 15.2 (H) 11/11/2006 1847   LYMPHSABS 2.8 11/11/2006 1847   MONOABS 1.2 (H) 11/11/2006 1847   EOSABS 0.0 11/11/2006 1847   BASOSABS 0.0 11/11/2006 1847    No results found for: POCLITH, LITHIUM   No results found for: PHENYTOIN, PHENOBARB, VALPROATE, CBMZ   .res Assessment: Plan:   Continue modafinil 200 mg one half tab p.o. twice daily to improve concentration and chronic fatigue due to medical conditions. Patient reports that his anxiety has been manageable without the use of BuSpar and will therefore not resume BuSpar at this time.  Patient advised to contact office if he experiences any recurrence of anxiety signs and symptoms and he agrees to do so. Patient to follow-up with this provider in 6 months or sooner if clinically indicated. Anxiety disorder, unspecified type  Please see After Visit Summary for patient specific instructions.  Future Appointments  Date Time Provider [place]  12/23/2018  1:15 PM [name], PMHNP CP-[facility] None    No orders of the defined types were placed in this encounter.     -------------------------------

## 2018-06-30 MED ORDER — MODAFINIL 200 MG PO TABS: ORAL_TABLET | ORAL | 5 refills | Status: DC

## 2018-12-23 ENCOUNTER — Encounter: Payer: Self-pay | Admitting: Psychiatry

## 2018-12-23 ENCOUNTER — Other Ambulatory Visit: Payer: Self-pay

## 2018-12-23 ENCOUNTER — Ambulatory Visit (INDEPENDENT_AMBULATORY_CARE_PROVIDER_SITE_OTHER): Payer: Medicare PPO | Admitting: Psychiatry

## 2018-12-23 VITALS — BP 98/64

## 2018-12-23 DIAGNOSIS — F1021 Alcohol dependence, in remission: Secondary | ICD-10-CM | POA: Diagnosis not present

## 2018-12-23 MED ORDER — MODAFINIL 200 MG PO TABS: 100.0000 mg | ORAL_TABLET | Freq: Two times a day (BID) | ORAL | 5 refills | Status: DC

## 2018-12-23 NOTE — Progress Notes (Signed)
George Higgins [id number] 01/19/1956 63 y.o.  Virtual Visit via Telephone Note  I connected with pt on 12/23/18 at  1:15 PM EST by telephone and verified that I am speaking with the correct person using two identifiers.   I discussed the limitations, risks, security and privacy concerns of performing an evaluation and management service by telephone and the availability of in person appointments. I also discussed with the patient that there may be a patient responsible charge related to this service. The patient expressed understanding and agreed to proceed.   I discussed the assessment and treatment plan with the patient. The patient was provided an opportunity to ask questions and all were answered. The patient agreed with the plan and demonstrated an understanding of the instructions.   The patient was advised to call back or seek an in-person evaluation if the symptoms worsen or if the condition fails to improve as anticipated.  I provided 25 minutes of non-face-to-face time during this encounter.  The patient was located at home.  The provider was located at [place].   [name], PMHNP   Subjective:   Patient ID:  George Elizabeth. is a 63 y.o. (DOB 01/19/1956) male.  Chief Complaint:  Chief Complaint  Patient presents with  . Follow-up    h/o ETOH dependence, anxiety, insomnia, and cognitive issues    HPI George Elizabeth. presents for follow-up of h/o anxiety, word-finding errors, and concentration impairment. He reports that he has been doing well overall. He denies any recent anxiety and reports that anxiety seems to have been work related. He reports that he has had some depression and attributes this to isolation and limited activities. He reports that he had a period where he had more depression awhile back and it has improved and is not pervasive. He reports that his sleep has been ok overall with episodic sleep disturbance. He reports that  insomnia no longer triggers anxiety as it did when he was working. He reports Modafanil seems to be helpful for energy, motivation, and concentration. He reports that improving these s/s leads to improved mood. He reports that his appetite has been stable. He reports that he continues to have difficulty recalling the names of things and certain words. He is unsure if cognitive issues have worsened since he has had limited social interaction. Reports that he had dinner with friends recently and there were several times he was unable to communicate his thoughts. Denies SI.   Bought a house and will be moving in January. Has been enjoying living in the [place] area. Has been attending AA virtual meetings. Denies any recent ETOH use or cravings to drink. Continues to see therapist online.   Review of Systems:  Review of Systems  Cardiovascular: Negative for palpitations.  Neurological: Negative for tremors.  Psychiatric/Behavioral:       Please refer to HPI    Medications: I have reviewed the patient's current medications.  Current Outpatient Medications  Medication Sig Dispense Refill  . aspirin EC 81 MG tablet Take 81 mg by mouth daily.    . cyclobenzaprine (FLEXERIL) 10 MG tablet Take 10 mg by mouth as needed for muscle spasms.    Marland Kitchen etanercept (ENBREL) 50 MG/ML injection Inject 50 mg into the skin once a week.    . famotidine (PEPCID) 40 MG tablet Take 40 mg by mouth daily.    Marland Kitchen FOLIC ACID PO Take by mouth.    . levothyroxine (SYNTHROID) 50 MCG tablet  Take 50 mcg by mouth daily before breakfast.    . METHOTREXATE SODIUM IJ once a week.    . metoprolol tartrate (LOPRESSOR) 25 MG tablet Take 25 mg by mouth daily.    . modafinil (PROVIGIL) 200 MG tablet Take 0.5 tablets (100 mg total) by mouth 2 (two) times daily. 30 tablet 5  . Multiple Vitamins-Minerals (ZINC PO) Take by mouth.    . OLMESARTAN MEDOXOMIL PO Take 10 mg by mouth.    . omega-3 acid ethyl esters (LOVAZA) 1 g capsule Take by  mouth 2 (two) times daily.    . tamsulosin (FLOMAX) 0.4 MG CAPS capsule Take 0.4 mg by mouth daily.    Marland Kitchen VITAMIN D, CHOLECALCIFEROL, PO Take by mouth.    . tadalafil (CIALIS) 10 MG tablet Take 10 mg by mouth daily as needed for erectile dysfunction.     No current facility-administered medications for this visit.     Medication Side Effects: None  Allergies: No Known Allergies  Past Medical History:  Diagnosis Date  . Alcohol dependence in remission ([place])   . Ankylosing spondylitis ([place])   . Depression   . GERD (gastroesophageal reflux disease)   . Hearing loss   . Hypertension   . Hypokalemia   . Rheumatoid arthritis ([place])   . Visual impairment     Family History  Problem Relation Age of Onset  . Alcohol abuse Unknown   . Heart disease Unknown   . Depression Unknown   . Diabetes Mother   . Hypertension Mother   . Alcohol abuse Mother   . Dementia Mother   . Heart attack Father   . Hypertension Father   . Alcohol abuse Father   . Alcohol abuse Brother   . Suicidality Brother   . Alcohol abuse Maternal Uncle   . Alcohol abuse Maternal Grandfather     Social History   Socioeconomic History  . Marital status: Married    Spouse name: Not on file  . Number of children: Not on file  . Years of education: Not on file  . Highest education level: Not on file  Occupational History  . Occupation: anesthesiologist  Social Needs  . Financial resource strain: Not on file  . Food insecurity    Worry: Not on file    Inability: Not on file  . Transportation needs    Medical: Not on file    Non-medical: Not on file  Tobacco Use  . Smoking status: Former Research scientist (life sciences)  . Smokeless tobacco: Never Used  Substance and Sexual Activity  . Alcohol use: Not Currently    Comment: Reports that last ETOH use was 10/16/15  . Drug use: Not on file  . Sexual activity: Not on file  Lifestyle  . Physical activity    Days per week: Not on file    Minutes per session: Not on file  .  Stress: Not on file  Relationships  . Social Herbalist on phone: Not on file    Gets together: Not on file    Attends religious service: Not on file    Active member of club or organization: Not on file    Attends meetings of clubs or organizations: Not on file    Relationship status: Not on file  . Intimate partner violence    Fear of current or ex partner: Not on file    Emotionally abused: Not on file    Physically abused: Not on file    Forced  sexual activity: Not on file  Other Topics Concern  . Not on file  Social History Narrative  . Not on file    Past Medical History, Surgical history, Social history, and Family history were reviewed and updated as appropriate.   Please see review of systems for further details on the patient's review from today.   Objective:   Physical Exam:  BP 98/64   Physical Exam Neurological:     Mental Status: He is alert and oriented to person, place, and time.     Cranial Nerves: No dysarthria.  Psychiatric:        Attention and Perception: Attention normal.        Mood and Affect: Mood normal.        Speech: Speech normal.        Behavior: Behavior is cooperative.        Thought Content: Thought content normal. Thought content is not paranoid or delusional. Thought content does not include homicidal or suicidal ideation. Thought content does not include homicidal or suicidal plan.        Cognition and Memory: Memory normal.        Judgment: Judgment normal.     Comments: Insight intact Pt pauses periodically as if searching for words.      Lab Review:     Component Value Date/Time   NA 138 11/14/2006 0405   K 4.1 11/14/2006 0405   CL 103 11/14/2006 0405   CO2 28 11/14/2006 0405   GLUCOSE 106 (H) 11/14/2006 0405   BUN 13 11/14/2006 0405   CREATININE 1.04 11/14/2006 0405   CALCIUM 9.0 11/14/2006 0405   PROT 5.5 (L) 11/14/2006 0405   ALBUMIN 3.2 (L) 11/14/2006 0405   AST 67 (H) 11/14/2006 0405   ALT 67 (H)  11/14/2006 0405   ALKPHOS 48 11/14/2006 0405   BILITOT 0.6 11/14/2006 0405   GFRNONAA >60 11/14/2006 0405   GFRAA  11/14/2006 0405    >60        The eGFR has been calculated using the MDRD equation. This calculation has not been validated in all clinical       Component Value Date/Time   WBC 11.8 (H) 11/11/2006 1847   RBC 4.77 11/11/2006 1847   HGB 15.4 11/11/2006 1847   HCT 43.2 11/11/2006 1847   PLT 198 11/11/2006 1847   MCV 90.7 11/11/2006 1847   MCHC 35.5 11/11/2006 1847   RDW 15.2 (H) 11/11/2006 1847   LYMPHSABS 2.8 11/11/2006 1847   MONOABS 1.2 (H) 11/11/2006 1847   EOSABS 0.0 11/11/2006 1847   BASOSABS 0.0 11/11/2006 1847    No results found for: POCLITH, LITHIUM   No results found for: PHENYTOIN, PHENOBARB, VALPROATE, CBMZ   .res Assessment: Plan:   Patient seen for 30 minutes and greater than 50% of visit spent counseling patient regarding treatment options.  Will continue modafinil since patient reports that modafinil has been very helpful for concentration, word finding issues, energy, motivation, and mood.  Patient reports that he is tolerating modafinil without difficulty.  Patient reports that depression has improved and does not see the need for additional treatment of depressive signs and symptoms at this time.  Patient reports that anxiety has been minimal to non- existent and seems to have been work-related. Will continue modafinil 100 mg twice daily. Recommend continuing psychotherapy and AA meetings. Patient to follow-up in 6 months or sooner if clinically indicated. Patient advised to contact office with any questions, adverse effects, or acute worsening  in signs and symptoms.   Hoyte was seen today for follow-up.  Diagnoses and all orders for this visit:  Alcohol dependence, in remission ([place])  Other orders -     modafinil (PROVIGIL) 200 MG tablet; Take 0.5 tablets (100 mg total) by mouth 2 (two) times daily.    Please see After Visit Summary  for patient specific instructions.  Future Appointments  Date Time Provider [place]  03/30/2019  1:15 PM [name], PMHNP CP-CP None    No orders of the defined types were placed in this encounter.     -------------------------------

## 2019-03-30 ENCOUNTER — Ambulatory Visit: Payer: Medicare PPO | Admitting: Psychiatry

## 2019-06-02 ENCOUNTER — Ambulatory Visit (INDEPENDENT_AMBULATORY_CARE_PROVIDER_SITE_OTHER): Payer: Medicare PPO | Admitting: Psychiatry

## 2019-06-02 ENCOUNTER — Encounter: Payer: Self-pay | Admitting: Psychiatry

## 2019-06-02 VITALS — BP 130/80 | HR 70

## 2019-06-02 DIAGNOSIS — F1021 Alcohol dependence, in remission: Secondary | ICD-10-CM

## 2019-06-02 DIAGNOSIS — R5383 Other fatigue: Secondary | ICD-10-CM | POA: Diagnosis not present

## 2019-06-02 DIAGNOSIS — F419 Anxiety disorder, unspecified: Secondary | ICD-10-CM

## 2019-06-02 MED ORDER — MODAFINIL 200 MG PO TABS: 100.0000 mg | ORAL_TABLET | Freq: Two times a day (BID) | ORAL | 5 refills | Status: DC

## 2019-06-02 NOTE — Progress Notes (Signed)
George Higgins [id number] 01/19/1956 64 y.o.  Virtual Visit via Video Note  I connected with pt @ on 06/02/19 at  1:00 PM EDT by a video enabled telemedicine application and verified that I am speaking with the correct person using two identifiers.   I discussed the limitations of evaluation and management by telemedicine and the availability of in person appointments. The patient expressed understanding and agreed to proceed.  I discussed the assessment and treatment plan with the patient. The patient was provided an opportunity to ask questions and all were answered. The patient agreed with the plan and demonstrated an understanding of the instructions.   The patient was advised to call back or seek an in-person evaluation if the symptoms worsen or if the condition fails to improve as anticipated.  I provided 30 minutes of non-face-to-face time during this encounter.  The patient was located at home.  The provider was located at [place].   [name], PMHNP   Subjective:   Patient ID:  George Higgins. is a 64 y.o. (DOB 01/19/1956) male.  Chief Complaint:  Chief Complaint  Patient presents with  . Follow-up    h/o ETOH dependence, h/o anxiety, and h/o impaired concentration and fatigue due to ankylosing spondylitis     HPI George Higgins. presents for follow-up for h/o anxiety,;ETOH dependence in full remission; and impaired concentration and fatigue due to ankylosing spondylitis. Pt reports that they bought a new house and he is enjoying this. He reports that he has not had any significant anxiety. He reports occ mild worry. He reports that he had anxiety when family member had a severe anxiety. Denies depressed mood. Sleep has been adequate. He reports that modafanil remains helpful for excessive somnolence and concentration. He denies any significant changes in memory. He reports that his energy is low. Motivation is "ok, it could be better." Appetite  has been good and is less than it was in years past. Denies SI.   He reports that he and his family have all been vaccinated. He reports that prior to vaccinations, he was quarantining from family members. He has been staying mostly at home. Continues to attend AA meetings virtually. Meets some friends from [place] weekly. Sees sponsor periodically. Denies cravings to use ETOH. Denies any recent ETOH use.  Reports that he and his wife do some exercise every morning.   Review of Systems:  Review of Systems  Constitutional: Positive for fatigue.  Musculoskeletal: Positive for back pain, neck pain and neck stiffness. Negative for gait problem.  Neurological: Negative for tremors.  Psychiatric/Behavioral:       Please refer to HPI   He had recent flare of ankylosing spondylitis and reports that this has improved some.   Medications: I have reviewed the patient's current medications.  Current Outpatient Medications  Medication Sig Dispense Refill  . aspirin EC 81 MG tablet Take 81 mg by mouth daily.    . cyclobenzaprine (FLEXERIL) 10 MG tablet Take 10 mg by mouth as needed for muscle spasms.    Marland Kitchen etanercept (ENBREL) 50 MG/ML injection Inject 50 mg into the skin once a week.    . Ezetimibe (ZETIA PO) Take by mouth.    . famotidine (PEPCID) 40 MG tablet Take 40 mg by mouth daily.    Marland Kitchen FOLIC ACID PO Take by mouth.    . levothyroxine (SYNTHROID) 50 MCG tablet Take 50 mcg by mouth daily before breakfast.    . METHOTREXATE SODIUM  IJ once a week.    . metoprolol tartrate (LOPRESSOR) 25 MG tablet Take 25 mg by mouth daily.    Derrill Memo ON 06/15/2019] modafinil (PROVIGIL) 200 MG tablet Take 0.5 tablets (100 mg total) by mouth 2 (two) times daily. 30 tablet 5  . Multiple Vitamins-Minerals (ZINC PO) Take by mouth.    . OLMESARTAN MEDOXOMIL PO Take 5 mg by mouth.     . omega-3 acid ethyl esters (LOVAZA) 1 g capsule Take by mouth 2 (two) times daily.    . Pitavastatin Calcium (LIVALO PO) Take by mouth.    .  tadalafil (CIALIS) 10 MG tablet Take 10 mg by mouth daily as needed for erectile dysfunction.    . tamsulosin (FLOMAX) 0.4 MG CAPS capsule Take 0.4 mg by mouth daily.    Marland Kitchen VITAMIN D, CHOLECALCIFEROL, PO Take by mouth.     No current facility-administered medications for this visit.    Medication Side Effects: None  Allergies: No Known Allergies  Past Medical History:  Diagnosis Date  . Alcohol dependence in remission ([place])   . Ankylosing spondylitis ([place])   . Depression   . GERD (gastroesophageal reflux disease)   . Hearing loss   . Hypertension   . Hypokalemia   . Rheumatoid arthritis ([place])   . Visual impairment     Family History  Problem Relation Age of Onset  . Alcohol abuse Unknown   . Heart disease Unknown   . Depression Unknown   . Diabetes Mother   . Hypertension Mother   . Alcohol abuse Mother   . Dementia Mother   . Heart attack Father   . Hypertension Father   . Alcohol abuse Father   . Alcohol abuse Brother   . Suicidality Brother   . Alcohol abuse Maternal Uncle   . Alcohol abuse Maternal Grandfather     Social History   Socioeconomic History  . Marital status: Married    Spouse name: Not on file  . Number of children: Not on file  . Years of education: Not on file  . Highest education level: Not on file  Occupational History  . Occupation: anesthesiologist  Tobacco Use  . Smoking status: Former Research scientist (life sciences)  . Smokeless tobacco: Never Used  Substance and Sexual Activity  . Alcohol use: Not Currently    Comment: Reports that last ETOH use was 10/16/15  . Drug use: Not on file  . Sexual activity: Not on file  Other Topics Concern  . Not on file  Social History Narrative  . Not on file   Social Determinants of Health   Financial Resource Strain:   . Difficulty of Paying Living Expenses:   Food Insecurity:   . Worried About Charity fundraiser in the Last Year:   . Arboriculturist in the Last Year:   Transportation Needs:   . Lexicographer (Medical):   Marland Kitchen Lack of Transportation (Non-Medical):   Physical Activity:   . Days of Exercise per Week:   . Minutes of Exercise per Session:   Stress:   . Feeling of Stress :   Social Connections:   . Frequency of Communication with Friends and Family:   . Frequency of Social Gatherings with Friends and Family:   . Attends Religious Services:   . Active Member of Clubs or Organizations:   . Attends Archivist Meetings:   Marland Kitchen Marital Status:   Intimate Partner Violence:   . Fear of Current or  Ex-Partner:   . Emotionally Abused:   Marland Kitchen Physically Abused:   . Sexually Abused:     Past Medical History, Surgical history, Social history, and Family history were reviewed and updated as appropriate.   Please see review of systems for further details on the patient's review from today.   Objective:   Physical Exam:  BP 130/80   Pulse 70   Physical Exam Neurological:     Mental Status: He is alert and oriented to person, place, and time.     Cranial Nerves: No dysarthria.  Psychiatric:        Attention and Perception: Attention and perception normal.        Mood and Affect: Mood normal.        Speech: Speech normal.        Behavior: Behavior is cooperative.        Thought Content: Thought content normal. Thought content is not paranoid or delusional. Thought content does not include homicidal or suicidal ideation. Thought content does not include homicidal or suicidal plan.        Cognition and Memory: Cognition and memory normal.        Judgment: Judgment normal.     Comments: Insight intact     Lab Review:     Component Value Date/Time   NA 138 11/14/2006 0405   K 4.1 11/14/2006 0405   CL 103 11/14/2006 0405   CO2 28 11/14/2006 0405   GLUCOSE 106 (H) 11/14/2006 0405   BUN 13 11/14/2006 0405   CREATININE 1.04 11/14/2006 0405   CALCIUM 9.0 11/14/2006 0405   PROT 5.5 (L) 11/14/2006 0405   ALBUMIN 3.2 (L) 11/14/2006 0405   AST 67 (H) 11/14/2006  0405   ALT 67 (H) 11/14/2006 0405   ALKPHOS 48 11/14/2006 0405   BILITOT 0.6 11/14/2006 0405   GFRNONAA >60 11/14/2006 0405   GFRAA  11/14/2006 0405    >60        The eGFR has been calculated using the MDRD equation. This calculation has not been validated in all clinical       Component Value Date/Time   WBC 11.8 (H) 11/11/2006 1847   RBC 4.77 11/11/2006 1847   HGB 15.4 11/11/2006 1847   HCT 43.2 11/11/2006 1847   PLT 198 11/11/2006 1847   MCV 90.7 11/11/2006 1847   MCHC 35.5 11/11/2006 1847   RDW 15.2 (H) 11/11/2006 1847   LYMPHSABS 2.8 11/11/2006 1847   MONOABS 1.2 (H) 11/11/2006 1847   EOSABS 0.0 11/11/2006 1847   BASOSABS 0.0 11/11/2006 1847    No results found for: POCLITH, LITHIUM   No results found for: PHENYTOIN, PHENOBARB, VALPROATE, CBMZ   .res Assessment: Plan:   Will continue modafanil 100 mg po BID for fatigue and concentration.  Pt to f/u in 6 months or sooner if clinically indicated.  Patient advised to contact office with any questions, adverse effects, or acute worsening in signs and symptoms.  Jatavious was seen today for follow-up.  Diagnoses and all orders for this visit:  Fatigue, unspecified type -     modafinil (PROVIGIL) 200 MG tablet; Take 0.5 tablets (100 mg total) by mouth 2 (two) times daily.  Alcohol dependence, in remission ([place])  Anxiety disorder, unspecified type     Please see After Visit Summary for patient specific instructions.  No future appointments.  No orders of the defined types were placed in this encounter.     -------------------------------

## 2019-06-22 ENCOUNTER — Ambulatory Visit: Payer: Medicare PPO | Admitting: Psychiatry

## 2019-11-09 ENCOUNTER — Ambulatory Visit: Payer: Medicare PPO | Admitting: Psychiatry

## 2019-11-09 ENCOUNTER — Other Ambulatory Visit: Payer: Self-pay

## 2019-11-09 ENCOUNTER — Encounter: Payer: Self-pay | Admitting: Psychiatry

## 2019-11-09 ENCOUNTER — Ambulatory Visit (INDEPENDENT_AMBULATORY_CARE_PROVIDER_SITE_OTHER): Payer: Medicare PPO | Admitting: Psychiatry

## 2019-11-09 VITALS — BP 125/74 | HR 61

## 2019-11-09 DIAGNOSIS — R5383 Other fatigue: Secondary | ICD-10-CM

## 2019-11-09 DIAGNOSIS — F419 Anxiety disorder, unspecified: Secondary | ICD-10-CM | POA: Diagnosis not present

## 2019-11-09 MED ORDER — MODAFINIL 200 MG PO TABS: 100.0000 mg | ORAL_TABLET | Freq: Two times a day (BID) | ORAL | 5 refills | Status: DC

## 2019-11-09 NOTE — Progress Notes (Signed)
George Higgins [id number] 01/19/1956 64 y.o.  Subjective:   Patient ID:  George Higgins. is a 64 y.o. (DOB 01/19/1956) male.  Chief Complaint:  Chief Complaint  Patient presents with  . Other    Low energy and cognitive changes due to medical condition    HPI George Higgins. presents to the office today for follow-up of anxiety. He denies any recent anxiety. He reports that he continues to have some marital issues and he reports that this does not cause him significant stress. He reports that his wife thinks he depressed, "but I don't think I am." He denies depressed mood. Reports that he is "unhappy" about health issues and memory. He reports that he has noticed some worsening in memory and recalling names recently. He reports that there were a few times he was not able to participate in conversation when visiting friends due to memory issues. Concentration is ok. Sometimes has difficulty falling or staying asleep and other times is able to sleep without difficulty. Appetite has been ok and fluctuates some. He reports that he has been eating more sweets and plans to try to reduce this. He reports that Modafanil is helpful for his energy and motivation. Denies SI.   He is enjoying his new house. Daughter was living with them over the summer and was working remotely. Daughter ended up moving to [place], [place] with her boyfriend.   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 He has been helping family with researching things. Organizing and researching investments. Enjoys reading. Reports that a couple they enjoyed are about to move to [place]. Sees son on a weekly basis. He has some close friends that are not in the [place] area. He reports that he and his brother have had a strained relationship.    He continues to attend AA and has multiple friends in [place]. Talks to therapist monthly.   Review of Systems:  Review of Systems  Cardiovascular:  Negative for chest pain and palpitations.  Musculoskeletal: Positive for back pain. Negative for gait problem.  Psychiatric/Behavioral:       Please refer to HPI     Recent autoimmune flare and lost use of right hand. He reports that flare has resolved.  Had COVID booster during flare   Medications: I have reviewed the patient's current medications.  Current Outpatient Medications  Medication Sig Dispense Refill  . aspirin EC 81 MG tablet Take 81 mg by mouth daily.    Marland Kitchen co-enzyme Q-10 30 MG capsule Take 30 mg by mouth 3 (three) times daily.    . cyclobenzaprine (FLEXERIL) 10 MG tablet Take 10 mg by mouth as needed for muscle spasms.    Marland Kitchen etanercept (ENBREL) 50 MG/ML injection Inject 50 mg into the skin once a week.    . Ezetimibe (ZETIA PO) Take by mouth.    . famotidine (PEPCID) 40 MG tablet Take 40 mg by mouth daily. Taking daily to BID    . FOLIC ACID PO Take by mouth.    . levothyroxine (SYNTHROID) 50 MCG tablet Take 50 mcg by mouth daily before breakfast.    . METHOTREXATE SODIUM IJ once a week.    . metoprolol tartrate (LOPRESSOR) 25 MG tablet Take 25 mg by mouth daily.    Derrill Memo ON 11/11/2019] modafinil (PROVIGIL) 200 MG tablet Take 0.5 tablets (100 mg total) by mouth 2 (two) times daily. 30 tablet 5  . OLMESARTAN MEDOXOMIL  PO Take 5 mg by mouth.     . Omega-3 Fatty Acids (FISH OIL) 1000 MG CAPS Take by mouth.    . Pitavastatin Calcium (LIVALO PO) Take by mouth.    . tadalafil (CIALIS) 10 MG tablet Take 10 mg by mouth daily.     . tamsulosin (FLOMAX) 0.4 MG CAPS capsule Take 0.4 mg by mouth daily.    . TURMERIC PO Take by mouth.     No current facility-administered medications for this visit.    Medication Side Effects: None  Allergies: No Known Allergies  Past Medical History:  Diagnosis Date  . Alcohol dependence in remission ([place])   . Ankylosing spondylitis ([place])   . Depression   . GERD (gastroesophageal reflux disease)   . Hearing loss   . Hypertension   .  Hypokalemia   . Prediabetes   . Rheumatoid arthritis ([place])   . Visual impairment     Family History  Problem Relation Age of Onset  . Alcohol abuse Other   . Heart disease Other   . Depression Other   . Diabetes Mother   . Hypertension Mother   . Alcohol abuse Mother   . Dementia Mother   . Heart attack Father   . Hypertension Father   . Alcohol abuse Father   . Alcohol abuse Brother   . Suicidality Brother   . Alcohol abuse Maternal Uncle   . Alcohol abuse Maternal Grandfather     Social History   Socioeconomic History  . Marital status: Married    Spouse name: Not on file  . Number of children: Not on file  . Years of education: Not on file  . Highest education level: Not on file  Occupational History  . Occupation: anesthesiologist  Tobacco Use  . Smoking status: Former Research scientist (life sciences)  . Smokeless tobacco: Never Used  Substance and Sexual Activity  . Alcohol use: Not Currently    Comment: Reports that last ETOH use was 10/16/15  . Drug use: Not on file  . Sexual activity: Not on file  Other Topics Concern  . Not on file  Social History Narrative  . Not on file   Social Determinants of Health   Financial Resource Strain:   . Difficulty of Paying Living Expenses: Not on file  Food Insecurity:   . Worried About Charity fundraiser in the Last Year: Not on file  . Ran Out of Food in the Last Year: Not on file  Transportation Needs:   . Lack of Transportation (Medical): Not on file  . Lack of Transportation (Non-Medical): Not on file  Physical Activity:   . Days of Exercise per Week: Not on file  . Minutes of Exercise per Session: Not on file  Stress:   . Feeling of Stress : Not on file  Social Connections:   . Frequency of Communication with Friends and Family: Not on file  . Frequency of Social Gatherings with Friends and Family: Not on file  . Attends Religious Services: Not on file  . Active Member of Clubs or Organizations: Not on file  . Attends Theatre manager Meetings: Not on file  . Marital Status: Not on file  Intimate Partner Violence:   . Fear of Current or Ex-Partner: Not on file  . Emotionally Abused: Not on file  . Physically Abused: Not on file  . Sexually Abused: Not on file    Past Medical History, Surgical history, Social history, and Family history were reviewed  and updated as appropriate.   Please see review of systems for further details on the patient's review from today.   Objective:   Physical Exam:  BP 125/74   Pulse 61   Physical Exam Constitutional:      General: He is not in acute distress. Musculoskeletal:        General: No deformity.  Neurological:     Mental Status: He is alert and oriented to person, place, and time.     Coordination: Coordination normal.  Psychiatric:        Attention and Perception: Attention and perception normal. He does not perceive auditory or visual hallucinations.        Mood and Affect: Mood normal. Mood is not anxious or depressed. Affect is not labile, blunt, angry or inappropriate.        Speech: Speech normal.        Behavior: Behavior normal.        Thought Content: Thought content normal. Thought content is not paranoid or delusional. Thought content does not include homicidal or suicidal ideation. Thought content does not include homicidal or suicidal plan.        Cognition and Memory: Cognition and memory normal.        Judgment: Judgment normal.     Comments: Insight intact     Lab Review:     Component Value Date/Time   NA 138 11/14/2006 0405   K 4.1 11/14/2006 0405   CL 103 11/14/2006 0405   CO2 28 11/14/2006 0405   GLUCOSE 106 (H) 11/14/2006 0405   BUN 13 11/14/2006 0405   CREATININE 1.04 11/14/2006 0405   CALCIUM 9.0 11/14/2006 0405   PROT 5.5 (L) 11/14/2006 0405   ALBUMIN 3.2 (L) 11/14/2006 0405   AST 67 (H) 11/14/2006 0405   ALT 67 (H) 11/14/2006 0405   ALKPHOS 48 11/14/2006 0405   BILITOT 0.6 11/14/2006 0405   GFRNONAA >60 11/14/2006  0405   GFRAA  11/14/2006 0405    >60        The eGFR has been calculated using the MDRD equation. This calculation has not been validated in all clinical       Component Value Date/Time   WBC 11.8 (H) 11/11/2006 1847   RBC 4.77 11/11/2006 1847   HGB 15.4 11/11/2006 1847   HCT 43.2 11/11/2006 1847   PLT 198 11/11/2006 1847   MCV 90.7 11/11/2006 1847   MCHC 35.5 11/11/2006 1847   RDW 15.2 (H) 11/11/2006 1847   LYMPHSABS 2.8 11/11/2006 1847   MONOABS 1.2 (H) 11/11/2006 1847   EOSABS 0.0 11/11/2006 1847   BASOSABS 0.0 11/11/2006 1847    No results found for: POCLITH, LITHIUM   No results found for: PHENYTOIN, PHENOBARB, VALPROATE, CBMZ   .res Assessment: Plan:   Continue Modafinil  100 mg po BID for energy and concentration. No treatment for anxiety indicated at this time.  Pt to follow-up in 6 months or sooner if clinically indicated.  Patient advised to contact office with any questions, adverse effects, or acute worsening in signs and symptoms.  Albaro was seen today for other.  Diagnoses and all orders for this visit:  Fatigue, unspecified type -     modafinil (PROVIGIL) 200 MG tablet; Take 0.5 tablets (100 mg total) by mouth 2 (two) times daily.  Anxiety disorder, unspecified type     Please see After Visit Summary for patient specific instructions.  No future appointments.  No orders of the defined types were placed  in this encounter.   -------------------------------

## 2020-04-11 ENCOUNTER — Telehealth: Payer: Self-pay | Admitting: Psychiatry

## 2020-04-11 ENCOUNTER — Other Ambulatory Visit: Payer: Self-pay

## 2020-04-11 DIAGNOSIS — R5383 Other fatigue: Secondary | ICD-10-CM

## 2020-04-11 MED ORDER — MODAFINIL 200 MG PO TABS: 100.0000 mg | ORAL_TABLET | Freq: Two times a day (BID) | ORAL | 5 refills | Status: DC

## 2020-04-11 NOTE — Telephone Encounter (Signed)
Patient requesting a refill on the Modafinil . He is schedule for a virtual appt on 4/7 last seen 11/09/19. Fill at the [facility] in [place].

## 2020-04-11 NOTE — Telephone Encounter (Signed)
Pended for Shanda Bumps to review and send

## 2020-04-23 ENCOUNTER — Telehealth: Payer: Self-pay | Admitting: Psychiatry

## 2020-04-23 NOTE — Telephone Encounter (Signed)
Patient called requesting to pick up refill early on his Modafinil. He will need medication before his scheduled appt on 4/7. Please direct questions to # [phone]

## 2020-04-25 NOTE — Telephone Encounter (Signed)
Last refill 04/16/20  Refill due 05/14/20

## 2020-04-27 NOTE — Telephone Encounter (Signed)
Pt called back to inform refill needs to be picked up 3/21. Not due until 3/28 for modafinil. Please advise if approve early refill @ [facility] .

## 2020-04-27 NOTE — Telephone Encounter (Signed)
Patient asking for early refill on 03/21. 1 week early. Please review

## 2020-04-27 NOTE — Telephone Encounter (Signed)
No problem. Thanks 

## 2020-05-01 NOTE — Telephone Encounter (Signed)
Pt called back asking for a early refill on Modafinil. Pt said pharmacy still does not have rx. Please send to [facility] in [place]. Pt leaving on 05/07/20.

## 2020-05-03 NOTE — Telephone Encounter (Signed)
Didn't want to call pharmacy too early because notes tend to get misplaced. Spoke with pharmacist and ok'd pt can pick up Rx on 05/06/20 which is 8 days early.

## 2020-05-24 ENCOUNTER — Encounter: Payer: Self-pay | Admitting: Psychiatry

## 2020-05-24 ENCOUNTER — Telehealth: Payer: Medicare PPO | Admitting: Psychiatry

## 2020-05-24 ENCOUNTER — Telehealth (INDEPENDENT_AMBULATORY_CARE_PROVIDER_SITE_OTHER): Payer: Medicare Other | Admitting: Psychiatry

## 2020-05-24 VITALS — BP 129/93 | HR 51

## 2020-05-24 DIAGNOSIS — R5383 Other fatigue: Secondary | ICD-10-CM

## 2020-05-24 NOTE — Progress Notes (Signed)
Nyron Mozer [id number] 08-06-1955 65 y.o.  Virtual Visit via Video Note  I connected with pt @ on 05/24/20 at  4:30 PM EDT by a video enabled telemedicine application and verified that I am speaking with the correct person using two identifiers.   I discussed the limitations of evaluation and management by telemedicine and the availability of in person appointments. The patient expressed understanding and agreed to proceed.  I discussed the assessment and treatment plan with the patient. The patient was provided an opportunity to ask questions and all were answered. The patient agreed with the plan and demonstrated an understanding of the instructions.   The patient was advised to call back or seek an in-person evaluation if the symptoms worsen or if the condition fails to improve as anticipated.  I provided 30 minutes of non-face-to-face time during this encounter.  The patient was located at home.  The provider was located at [place].   [name], PMHNP   Subjective:   Patient ID:  Charna Elizabeth. is a 65 y.o. (DOB 19-Apr-1955) male.  Chief Complaint:  Chief Complaint  Patient presents with  . Other    Cognitive changes due to Medical condition    HPI Charna Elizabeth. presents for follow-up of fatigue and cognitive changes due to medical condition and h/o anxiety and insomnia. He reports that his memory has not changed since last visit. He reports that he continues to have difficulty recalling names at times. He reports that he worries about memory difficulties.  Denies word finding difficulties. Appetite has been ok. He reports that Modafinil has been helpful for his energy. He reports that he has "some" depression which he describes as situational. He denies anxiety. He reports occasional insomnia and sleeps well. Denies SI.   He reports that he has been taking classes at a lifelong learning center and has been enjoying this.  He has maintained  sobriety. He is no longer in therapy since therapist moved to [place]. He remains active in [place] and had a sponsor.   Visited daughter in [place], [place] last week.   Review of Systems:  Review of Systems  Cardiovascular: Negative for palpitations.  Musculoskeletal: Negative for gait problem.       Some improvement in pain  Psychiatric/Behavioral:       Please refer to HPI   He had one episode of chest pain and echocardiogram was WNL.    Medications: I have reviewed the patient's current medications.  Current Outpatient Medications  Medication Sig Dispense Refill  . co-enzyme Q-10 30 MG capsule Take 30 mg by mouth 3 (three) times daily.    . cyclobenzaprine (FLEXERIL) 10 MG tablet Take 10 mg by mouth as needed for muscle spasms.    . famotidine (PEPCID) 40 MG tablet Take 40 mg by mouth daily.    . Golimumab ([place] ARIA IV) Inject into the vein.    Marland Kitchen levothyroxine (SYNTHROID) 50 MCG tablet Take 50 mcg by mouth daily before breakfast.    . metoprolol tartrate (LOPRESSOR) 25 MG tablet Take 25 mg by mouth daily.    . modafinil (PROVIGIL) 200 MG tablet Take 0.5 tablets (100 mg total) by mouth 2 (two) times daily. 30 tablet 5  . Multiple Vitamin (MULTIVITAMIN) tablet Take 1 tablet by mouth daily.    Marland Kitchen OLMESARTAN MEDOXOMIL PO Take 5 mg by mouth.     . Omega-3 Fatty Acids (FISH OIL) 1000 MG CAPS Take by mouth.    Marland Kitchen  PITAVASTATIN MAGNESIUM PO Take by mouth.    . tadalafil (CIALIS) 10 MG tablet Take 10 mg by mouth daily.    . tamsulosin (FLOMAX) 0.4 MG CAPS capsule Take 0.4 mg by mouth daily.    . TURMERIC PO Take by mouth.     No current facility-administered medications for this visit.    Medication Side Effects: None  Allergies: No Known Allergies  Past Medical History:  Diagnosis Date  . Alcohol dependence in remission ([place])   . Ankylosing spondylitis ([place])   . Depression   . GERD (gastroesophageal reflux disease)   . Hearing loss   . Hypertension   . Hypokalemia   .  Prediabetes   . Rheumatoid arthritis ([place])   . Visual impairment     Family History  Problem Relation Age of Onset  . Alcohol abuse Other   . Heart disease Other   . Depression Other   . Diabetes Mother   . Hypertension Mother   . Alcohol abuse Mother   . Dementia Mother   . Heart attack Father   . Hypertension Father   . Alcohol abuse Father   . Alcohol abuse Brother   . Suicidality Brother   . Alcohol abuse Maternal Uncle   . Alcohol abuse Maternal Grandfather     Social History   Socioeconomic History  . Marital status: Married    Spouse name: Not on file  . Number of children: Not on file  . Years of education: Not on file  . Highest education level: Not on file  Occupational History  . Occupation: anesthesiologist  Tobacco Use  . Smoking status: Former Research scientist (life sciences)  . Smokeless tobacco: Never Used  Substance and Sexual Activity  . Alcohol use: Not Currently    Comment: Reports that last ETOH use was 10/16/15  . Drug use: Not on file  . Sexual activity: Not on file  Other Topics Concern  . Not on file  Social History Narrative  . Not on file   Social Determinants of Health   Financial Resource Strain: Not on file  Food Insecurity: Not on file  Transportation Needs: Not on file  Physical Activity: Not on file  Stress: Not on file  Social Connections: Not on file  Intimate Partner Violence: Not on file    Past Medical History, Surgical history, Social history, and Family history were reviewed and updated as appropriate.   Please see review of systems for further details on the patient's review from today.   Objective:   Physical Exam:  BP (!) 129/93   Pulse (!) 51   Physical Exam Neurological:     Mental Status: He is alert and oriented to person, place, and time.     Cranial Nerves: No dysarthria.  Psychiatric:        Attention and Perception: Attention and perception normal.        Mood and Affect: Mood normal.        Speech: Speech normal.         Behavior: Behavior is cooperative.        Thought Content: Thought content normal. Thought content is not paranoid or delusional. Thought content does not include homicidal or suicidal ideation. Thought content does not include homicidal or suicidal plan.        Cognition and Memory: Cognition and memory normal.        Judgment: Judgment normal.     Comments: Insight intact     Lab Review:  Component Value Date/Time   NA 138 11/14/2006 0405   K 4.1 11/14/2006 0405   CL 103 11/14/2006 0405   CO2 28 11/14/2006 0405   GLUCOSE 106 (H) 11/14/2006 0405   BUN 13 11/14/2006 0405   CREATININE 1.04 11/14/2006 0405   CALCIUM 9.0 11/14/2006 0405   PROT 5.5 (L) 11/14/2006 0405   ALBUMIN 3.2 (L) 11/14/2006 0405   AST 67 (H) 11/14/2006 0405   ALT 67 (H) 11/14/2006 0405   ALKPHOS 48 11/14/2006 0405   BILITOT 0.6 11/14/2006 0405   GFRNONAA >60 11/14/2006 0405   GFRAA  11/14/2006 0405    >60        The eGFR has been calculated using the MDRD equation. This calculation has not been validated in all clinical       Component Value Date/Time   WBC 11.8 (H) 11/11/2006 1847   RBC 4.77 11/11/2006 1847   HGB 15.4 11/11/2006 1847   HCT 43.2 11/11/2006 1847   PLT 198 11/11/2006 1847   MCV 90.7 11/11/2006 1847   MCHC 35.5 11/11/2006 1847   RDW 15.2 (H) 11/11/2006 1847   LYMPHSABS 2.8 11/11/2006 1847   MONOABS 1.2 (H) 11/11/2006 1847   EOSABS 0.0 11/11/2006 1847   BASOSABS 0.0 11/11/2006 1847    No results found for: POCLITH, LITHIUM   No results found for: PHENYTOIN, PHENOBARB, VALPROATE, CBMZ   .res Assessment: Plan:   Will continue Modafinil 100 mg po BID since this has been helpful for fatigue and cognitive changes due to medical condition.  Pt denies any anxiety s/s and he does not appear anxious on exam. No treatment for anxiety indicated at this time. He also reports adequate sleep most nights and therefore no treatment is indicated for insomnia  Pt to follow-up in 6 months or  sooner if clinically indicated.  Patient advised to contact office with any questions, adverse effects, or acute worsening in signs and symptoms.  Elston was seen today for other.  Diagnoses and all orders for this visit:  Fatigue, unspecified type     Please see After Visit Summary for patient specific instructions.  No future appointments.  No orders of the defined types were placed in this encounter.     -------------------------------

## 2020-10-12 ENCOUNTER — Other Ambulatory Visit: Payer: Self-pay | Admitting: Psychiatry

## 2020-10-12 DIAGNOSIS — R5383 Other fatigue: Secondary | ICD-10-CM

## 2020-10-12 NOTE — Telephone Encounter (Signed)
Last filled 7/30 appt on 9/27

## 2020-10-12 NOTE — Telephone Encounter (Signed)
George Higgins called to make sure we got this request for the refill of his Modafinil.  He is going out of town Tuesday and needs to be sure he can get this picked up prior to leaving.  Has appt 11/13/20.  Send to [facility] in [place], [place]

## 2020-11-13 ENCOUNTER — Telehealth (INDEPENDENT_AMBULATORY_CARE_PROVIDER_SITE_OTHER): Payer: Medicare Other | Admitting: Psychiatry

## 2020-11-13 ENCOUNTER — Encounter: Payer: Self-pay | Admitting: Psychiatry

## 2020-11-13 DIAGNOSIS — R5383 Other fatigue: Secondary | ICD-10-CM | POA: Diagnosis not present

## 2020-11-13 MED ORDER — MODAFINIL 200 MG PO TABS: 100.0000 mg | ORAL_TABLET | Freq: Two times a day (BID) | ORAL | 5 refills | Status: DC

## 2020-11-13 NOTE — Progress Notes (Signed)
George Higgins [id number] 1955/12/23 65 y.o.  Virtual Visit via Video Note  I connected with pt @ on 11/13/20 at  1:00 PM EDT by a video enabled telemedicine application and verified that I am speaking with the correct person using two identifiers.   I discussed the limitations of evaluation and management by telemedicine and the availability of in person appointments. The patient expressed understanding and agreed to proceed.  I discussed the assessment and treatment plan with the patient. The patient was provided an opportunity to ask questions and all were answered. The patient agreed with the plan and demonstrated an understanding of the instructions.   The patient was advised to call back or seek an in-person evaluation if the symptoms worsen or if the condition fails to improve as anticipated.  I provided 30 minutes of non-face-to-face time during this encounter.  The patient was located in [place].  The provider was located at [place].   [name], PMHNP   Subjective:   Patient ID:  George Higgins. is a 65 y.o. (DOB 1955/06/14) male.  Chief Complaint:  Chief Complaint  Patient presents with   Follow-up    H/o anxiety    HPI George Higgins. presents for follow-up of anxiety and h/o insomnia. 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 He is considering part-time work after he offered to volunteered for group for addictions. He has been auditing some classes.   Denies any anxiety with volunteer work. He reports that he has some difficulty with memory and will remember some things and other things he has difficulty with. Denies anxiety. Denies depressed mood. Sleeping well. Appetite has been ok. Concentration is adequate. Denies SI.   He reports that he has been feeling better since change in med- stiffness in back and hand. Energy remains low.   Remains  active in recovery and reports that he will off PHP in 1-2 months. He reports that his long-term disability benefits end soon.   Son moved in with them after son broke up with girlfriend and losing his job. Son is starting a new job. He reports that he and his son have a good relationship.    Review of Systems:  Review of Systems  Cardiovascular:  Negative for palpitations.  Musculoskeletal:  Positive for neck stiffness. Negative for gait problem.       Plantar fasciitis. Less neck and back stiffness with [facility]  Neurological:  Negative for tremors.  Psychiatric/Behavioral:         Please refer to HPI   Medications: I have reviewed the patient's current medications.  Current Outpatient Medications  Medication Sig Dispense Refill   co-enzyme Q-10 30 MG capsule Take 30 mg by mouth 3 (three) times daily.     cyclobenzaprine (FLEXERIL) 10 MG tablet Take 10 mg by mouth as needed for muscle spasms.     famotidine (PEPCID) 40 MG tablet Take 40 mg by mouth daily.     Golimumab ([place] ARIA IV) Inject into the vein.     levothyroxine (SYNTHROID) 50 MCG tablet Take 50 mcg by mouth daily before breakfast.     metoprolol tartrate (LOPRESSOR) 25 MG tablet Take 25 mg by mouth daily.     Multiple Vitamin (MULTIVITAMIN) tablet Take 1 tablet by mouth daily.     OLMESARTAN MEDOXOMIL PO Take 5 mg by mouth.  Omega-3 Fatty Acids (FISH OIL) 1000 MG CAPS Take by mouth.     PITAVASTATIN MAGNESIUM PO Take by mouth.     tadalafil (CIALIS) 10 MG tablet Take 10 mg by mouth daily.     tamsulosin (FLOMAX) 0.4 MG CAPS capsule Take 0.4 mg by mouth daily.     TURMERIC PO Take by mouth.     modafinil (PROVIGIL) 200 MG tablet Take 0.5 tablets (100 mg total) by mouth 2 (two) times daily. 30 tablet 5   No current facility-administered medications for this visit.    Medication Side Effects: None  Allergies: No Known Allergies  Past Medical History:  Diagnosis Date   Alcohol dependence in remission ([facility])     Ankylosing spondylitis ([facility])    Depression    GERD (gastroesophageal reflux disease)    Hearing loss    Hypertension    Hypokalemia    Prediabetes    Rheumatoid arthritis ([place])    Visual impairment     Family History  Problem Relation Age of Onset   Alcohol abuse Other    Heart disease Other    Depression Other    Diabetes Mother    Hypertension Mother    Alcohol abuse Mother    Dementia Mother    Heart attack Father    Hypertension Father    Alcohol abuse Father    Alcohol abuse Brother    Suicidality Brother    Alcohol abuse Maternal Uncle    Alcohol abuse Maternal Grandfather     Social History   Socioeconomic History   Marital status: Married    Spouse name: Not on file   Number of children: Not on file   Years of education: Not on file   Highest education level: Not on file  Occupational History   Occupation: anesthesiologist  Tobacco Use   Smoking status: Former   Smokeless tobacco: Never  Substance and Sexual Activity   Alcohol use: Not Currently    Comment: Reports that last ETOH use was 10/16/15   Drug use: Not on file   Sexual activity: Not on file  Other Topics Concern   Not on file  Social History Narrative   Not on file   Social Determinants of Health   Financial Resource Strain: Not on file  Food Insecurity: Not on file  Transportation Needs: Not on file  Physical Activity: Not on file  Stress: Not on file  Social Connections: Not on file  Intimate Partner Violence: Not on file    Past Medical History, Surgical history, Social history, and Family history were reviewed and updated as appropriate.   Please see review of systems for further details on the patient's review from today.   Objective:   Physical Exam:  BP 120/80   Pulse 65   Physical Exam Neurological:     Mental Status: He is alert and oriented to person, place, and time.     Cranial Nerves: No dysarthria.  Psychiatric:        Attention and Perception: Attention  and perception normal.        Mood and Affect: Mood normal.        Speech: Speech normal.        Behavior: Behavior is cooperative.        Thought Content: Thought content normal. Thought content is not paranoid or delusional. Thought content does not include homicidal or suicidal ideation. Thought content does not include homicidal or suicidal plan.  Cognition and Memory: Cognition and memory normal.        Judgment: Judgment normal.     Comments: Insight intact    Lab Review:     Component Value Date/Time   NA 138 11/14/2006 0405   K 4.1 11/14/2006 0405   CL 103 11/14/2006 0405   CO2 28 11/14/2006 0405   GLUCOSE 106 (H) 11/14/2006 0405   BUN 13 11/14/2006 0405   CREATININE 1.04 11/14/2006 0405   CALCIUM 9.0 11/14/2006 0405   PROT 5.5 (L) 11/14/2006 0405   ALBUMIN 3.2 (L) 11/14/2006 0405   AST 67 (H) 11/14/2006 0405   ALT 67 (H) 11/14/2006 0405   ALKPHOS 48 11/14/2006 0405   BILITOT 0.6 11/14/2006 0405   GFRNONAA >60 11/14/2006 0405   GFRAA  11/14/2006 0405    >60        The eGFR has been calculated using the MDRD equation. This calculation has not been validated in all clinical       Component Value Date/Time   WBC 11.8 (H) 11/11/2006 1847   RBC 4.77 11/11/2006 1847   HGB 15.4 11/11/2006 1847   HCT 43.2 11/11/2006 1847   PLT 198 11/11/2006 1847   MCV 90.7 11/11/2006 1847   MCHC 35.5 11/11/2006 1847   RDW 15.2 (H) 11/11/2006 1847   LYMPHSABS 2.8 11/11/2006 1847   MONOABS 1.2 (H) 11/11/2006 1847   EOSABS 0.0 11/11/2006 1847   BASOSABS 0.0 11/11/2006 1847    No results found for: POCLITH, LITHIUM   No results found for: PHENYTOIN, PHENOBARB, VALPROATE, CBMZ   .res Assessment: Plan:    Will continue Modafinil 100 mg po BID for fatigue. Anxiety and insomnia are currently controlled and therefore no medications are recommended at this time.  Pt to follow-up in 6 months or sooner if clinically indicated.  Patient advised to contact office with any  questions, adverse effects, or acute worsening in signs and symptoms.   George Higgins was seen today for follow-up.  Diagnoses and all orders for this visit:  Fatigue, unspecified type -     modafinil (PROVIGIL) 200 MG tablet; Take 0.5 tablets (100 mg total) by mouth 2 (two) times daily.    Please see After Visit Summary for patient specific instructions.  No future appointments.  No orders of the defined types were placed in this encounter.     -------------------------------

## 2021-04-22 ENCOUNTER — Encounter: Payer: Self-pay | Admitting: Psychiatry

## 2021-04-22 ENCOUNTER — Telehealth (INDEPENDENT_AMBULATORY_CARE_PROVIDER_SITE_OTHER): Payer: Medicare Other | Admitting: Psychiatry

## 2021-04-22 DIAGNOSIS — R5383 Other fatigue: Secondary | ICD-10-CM | POA: Diagnosis not present

## 2021-04-22 DIAGNOSIS — F419 Anxiety disorder, unspecified: Secondary | ICD-10-CM | POA: Diagnosis not present

## 2021-04-22 MED ORDER — MODAFINIL 200 MG PO TABS: 100.0000 mg | ORAL_TABLET | Freq: Two times a day (BID) | ORAL | 5 refills | Status: DC

## 2021-04-22 NOTE — Progress Notes (Signed)
George Higgins [id number] 1955-12-13 66 y.o.  Virtual Visit via Video Note  I connected with pt @ on 04/22/21 at 12:45 PM EST by a video enabled telemedicine application and verified that I am speaking with the correct person using two identifiers.   I discussed the limitations of evaluation and management by telemedicine and the availability of in person appointments. The patient expressed understanding and agreed to proceed.  I discussed the assessment and treatment plan with the patient. The patient was provided an opportunity to ask questions and all were answered. The patient agreed with the plan and demonstrated an understanding of the instructions.   The patient was advised to call back or seek an in-person evaluation if the symptoms worsen or if the condition fails to improve as anticipated.  I provided 30 minutes of non-face-to-face time during this encounter.  The patient was located at home.  The provider was located at [place].   [name], PMHNP   Subjective:   Patient ID:  George Higgins. is a 66 y.o. (DOB [birth year]) male.  Chief Complaint:  Chief Complaint  Patient presents with   Follow-up    Anxiety, insomnia    HPI George Higgins. presents for follow-up of anxiety, insomnia, and fatigue. He reports that he has had several changes in the last 6 months. He reports that his med was switched from [place] to an IV medication and has had a significant improvement in physical s/s.   Denies any significant anxiety. He reports that concentration has been "ok." Energy and motivation have been good. He reports that he has some difficulty with ST memory. He reports that he plans to use notes to help remember things. He reports occ mild depression that "comes and goes." He reports that his sleep varies. Appetite is less than it was in the past. Denies SI.   He reports that he is no longer on disability. He has been training for a part-time  job  for the last 2 months. 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 He volunteers periodically at the [facility]. He has been researching anesthesia during the Civil war and plans to give a presentation. He reports that he has been increasingly busy.   He reports that he remains active in recovery. He denies any recent cravings for ETOH.   Temazepam- He reports that he has a script from PCP and takes on rare occasions.  Benadryl Buspar Modafinil  Review of Systems:  Review of Systems  Musculoskeletal:  Negative for gait problem.       Plantar fasciitis  Psychiatric/Behavioral:         Please refer to HPI   Medications: I have reviewed the patient's current medications.  Current Outpatient Medications  Medication Sig Dispense Refill   co-enzyme Q-10 30 MG capsule Take 30 mg by mouth 3 (three) times daily.     famotidine (PEPCID) 40 MG tablet Take 40 mg by mouth daily.     Golimumab ([place] ARIA IV) Inject into the vein.     levothyroxine (SYNTHROID) 50 MCG tablet Take 50 mcg by mouth daily before breakfast.     metoprolol tartrate (LOPRESSOR) 25 MG tablet Take 25 mg by mouth daily.     Multiple Vitamin (MULTIVITAMIN) tablet Take 1 tablet by mouth daily.     OLMESARTAN MEDOXOMIL PO Take 5 mg by mouth.  Omega-3 Fatty Acids (FISH OIL) 1000 MG CAPS Take by mouth.     PITAVASTATIN MAGNESIUM PO Take by mouth.     tadalafil (CIALIS) 10 MG tablet Take 10 mg by mouth daily.     tamsulosin (FLOMAX) 0.4 MG CAPS capsule Take 0.4 mg by mouth daily.     TURMERIC PO Take by mouth.     cyclobenzaprine (FLEXERIL) 10 MG tablet Take 10 mg by mouth as needed for muscle spasms.     [START ON 05/13/2021] modafinil (PROVIGIL) 200 MG tablet Take 0.5 tablets (100 mg total) by mouth 2 (two) times daily. 30 tablet 5   No current facility-administered medications for this visit.    Medication Side  Effects: None  Allergies: No Known Allergies  Past Medical History:  Diagnosis Date   Alcohol dependence in remission (HCC)    Ankylosing spondylitis (HCC)    Depression    GERD (gastroesophageal reflux disease)    Hearing loss    Hypertension    Hypokalemia    Prediabetes    Rheumatoid arthritis ([place])    Visual impairment     Family History  Problem Relation Age of Onset   Alcohol abuse Other    Heart disease Other    Depression Other    Diabetes Mother    Hypertension Mother    Alcohol abuse Mother    Dementia Mother    Heart attack Father    Hypertension Father    Alcohol abuse Father    Alcohol abuse Brother    Suicidality Brother    Alcohol abuse Maternal Uncle    Alcohol abuse Maternal Grandfather     Social History   Socioeconomic History   Marital status: Married    Spouse name: Not on file   Number of children: Not on file   Years of education: Not on file   Highest education level: Not on file  Occupational History   Occupation: anesthesiologist  Tobacco Use   Smoking status: Former   Smokeless tobacco: Never  Substance and Sexual Activity   Alcohol use: Not Currently    Comment: Reports that last ETOH use was 10/16/15   Drug use: Not on file   Sexual activity: Not on file  Other Topics Concern   Not on file  Social History Narrative   Not on file   Social Determinants of Health   Financial Resource Strain: Not on file  Food Insecurity: Not on file  Transportation Needs: Not on file  Physical Activity: Not on file  Stress: Not on file  Social Connections: Not on file  Intimate Partner Violence: Not on file    Past Medical History, Surgical history, Social history, and Family history were reviewed and updated as appropriate.   Please see review of systems for further details on the patient's review from today.   Objective:   Physical Exam:  There were no vitals taken for this visit.  He reports that BP has been stable.    Physical Exam Neurological:     Mental Status: He is alert and oriented to person, place, and time.     Cranial Nerves: No dysarthria.  Psychiatric:        Attention and Perception: Attention and perception normal.        Mood and Affect: Mood normal.        Speech: Speech normal.        Behavior: Behavior is cooperative.        Thought Content: Thought content normal.  Thought content is not paranoid or delusional. Thought content does not include homicidal or suicidal ideation. Thought content does not include homicidal or suicidal plan.        Cognition and Memory: Cognition and memory normal.        Judgment: Judgment normal.     Comments: Insight intact    Lab Review:     Component Value Date/Time   NA 138 11/14/2006 0405   K 4.1 11/14/2006 0405   CL 103 11/14/2006 0405   CO2 28 11/14/2006 0405   GLUCOSE 106 (H) 11/14/2006 0405   BUN 13 11/14/2006 0405   CREATININE 1.04 11/14/2006 0405   CALCIUM 9.0 11/14/2006 0405   PROT 5.5 (L) 11/14/2006 0405   ALBUMIN 3.2 (L) 11/14/2006 0405   AST 67 (H) 11/14/2006 0405   ALT 67 (H) 11/14/2006 0405   ALKPHOS 48 11/14/2006 0405   BILITOT 0.6 11/14/2006 0405   GFRNONAA >60 11/14/2006 0405   GFRAA  11/14/2006 0405    >60        The eGFR has been calculated using the MDRD equation. This calculation has not been validated in all clinical       Component Value Date/Time   WBC 11.8 (H) 11/11/2006 1847   RBC 4.77 11/11/2006 1847   HGB 15.4 11/11/2006 1847   HCT 43.2 11/11/2006 1847   PLT 198 11/11/2006 1847   MCV 90.7 11/11/2006 1847   MCHC 35.5 11/11/2006 1847   RDW 15.2 (H) 11/11/2006 1847   LYMPHSABS 2.8 11/11/2006 1847   MONOABS 1.2 (H) 11/11/2006 1847   EOSABS 0.0 11/11/2006 1847   BASOSABS 0.0 11/11/2006 1847    No results found for: POCLITH, LITHIUM   No results found for: PHENYTOIN, PHENOBARB, VALPROATE, CBMZ   .res Assessment: Plan:   Pt seen for 30 minutes and time spent discussing recent psychosocial  changes. He reports that Modafinil remains helpful for his concentration and energy and will likely continue to need Modafinil with anticipated return to work. Will continue Modafinil 100 mg po BID for fatigue and concentration.  Discussed contacting office if he experiences recurrence of insomnia or anxiety with return to work.  Pt to follow-up in 6 months or sooner if clinically indicated.  Patient advised to contact office with any questions, adverse effects, or acute worsening in signs and symptoms.  Senica was seen today for follow-up.  Diagnoses and all orders for this visit:  Fatigue, unspecified type -     modafinil (PROVIGIL) 200 MG tablet; Take 0.5 tablets (100 mg total) by mouth 2 (two) times daily.  Anxiety disorder, unspecified type     Please see After Visit Summary for patient specific instructions.  No future appointments.   No orders of the defined types were placed in this encounter.     -------------------------------

## 2021-10-10 ENCOUNTER — Other Ambulatory Visit: Payer: Self-pay

## 2021-10-10 ENCOUNTER — Telehealth: Payer: Self-pay | Admitting: Psychiatry

## 2021-10-10 DIAGNOSIS — R5383 Other fatigue: Secondary | ICD-10-CM

## 2021-10-10 MED ORDER — MODAFINIL 200 MG PO TABS: 100.0000 mg | ORAL_TABLET | Freq: Two times a day (BID) | ORAL | 5 refills | Status: DC

## 2021-10-10 NOTE — Telephone Encounter (Signed)
Pt called and said that he had the pharmacy transfer his modafinil to [place] because he was suppose to go to a conference. However his conference was cancelled and he needs a new script of modafinil sent to the [facility] in [place]. He will be there Saturday and will be able to pick it up. His next appt is sept 11th

## 2021-10-10 NOTE — Telephone Encounter (Signed)
Called and pharmacy does not open until 10:00.

## 2021-10-10 NOTE — Telephone Encounter (Signed)
Last filled 7/27. Pended.

## 2021-10-28 ENCOUNTER — Encounter: Payer: Self-pay | Admitting: Psychiatry

## 2021-10-28 ENCOUNTER — Telehealth (INDEPENDENT_AMBULATORY_CARE_PROVIDER_SITE_OTHER): Payer: Medicare Other | Admitting: Psychiatry

## 2021-10-28 VITALS — BP 120/60 | HR 55

## 2021-10-28 DIAGNOSIS — F419 Anxiety disorder, unspecified: Secondary | ICD-10-CM | POA: Diagnosis not present

## 2021-10-28 DIAGNOSIS — R5383 Other fatigue: Secondary | ICD-10-CM

## 2021-10-28 MED ORDER — MODAFINIL 200 MG PO TABS: 100.0000 mg | ORAL_TABLET | Freq: Two times a day (BID) | ORAL | 1 refills | Status: DC

## 2021-10-28 MED ORDER — CEREFOLIN NAC 6-90.314-2-600 MG PO TABS: 1.0000 | ORAL_TABLET | Freq: Every day | ORAL | 1 refills | Status: AC

## 2021-10-28 NOTE — Progress Notes (Signed)
George Higgins [id number] [birth year] 66 y.o.  Virtual Visit via Video Note  I connected with pt @ on 10/28/21 at  8:30 AM EDT by a video enabled telemedicine application and verified that I am speaking with the correct person using two identifiers.   I discussed the limitations of evaluation and management by telemedicine and the availability of in person appointments. The patient expressed understanding and agreed to proceed.  I discussed the assessment and treatment plan with the patient. The patient was provided an opportunity to ask questions and all were answered. The patient agreed with the plan and demonstrated an understanding of the instructions.   The patient was advised to call back or seek an in-person evaluation if the symptoms worsen or if the condition fails to improve as anticipated.  I provided 30 minutes of non-face-to-face time during this encounter.  The patient was located in [place], [place].  The provider was located at [place].   [name], PMHNP   Subjective:   Patient ID:  George Elizabeth. is a 66 y.o. (DOB 07-Jan-1956) male.  Chief Complaint:  Chief Complaint  Patient presents with   Follow-up    Insomnia, anxiety    HPI George Elizabeth. presents for follow-up of insomnia and fatigue. He reports "so far it is only good anxiety" and has helped him learn. He has to wake up at 5:15 am and is having some insomnia. He reports that he has not experienced any excessive daytime somnolence. Denies depressed mood. He reports adequate concentration overall. He denies any times where memory has been an issue. Appetite has been normal. Motivation has been high. Denies SI.   He reports that he has been working again. He reports that his autoimmune symptoms have been well controlled with new med. He is working in [employer] through the week.   Plans to go to [place] (11/8-11/19) to volunteer for a week. He will then be going to his daughter's in  [place].   He reports continued marital stress.   Currently not seeing a therapist. He has completed PHP. He reports that he goes to a Saturday morning AA meeting and keeps in touch with sponsor. He denies any cravings to drink. Has supportive friends that are also in [place].    Review of Systems:  Review of Systems  Cardiovascular:        Had 2 episodes of SVT when he decreased Metoprolol  Musculoskeletal:  Negative for gait problem.  Neurological:  Negative for tremors.  Psychiatric/Behavioral:         Please refer to HPI    Medications: I have reviewed the patient's current medications.  Current Outpatient Medications  Medication Sig Dispense Refill   co-enzyme Q-10 30 MG capsule Take 30 mg by mouth 3 (three) times daily.     cyclobenzaprine (FLEXERIL) 10 MG tablet Take 10 mg by mouth as needed for muscle spasms.     famotidine (PEPCID) 40 MG tablet Take 40 mg by mouth daily.     Golimumab ([place] ARIA IV) Inject into the vein.     levothyroxine (SYNTHROID) 50 MCG tablet Take 50 mcg by mouth daily before breakfast.     Methylfol-Algae-B12-Acetylcyst (CEREFOLIN NAC) 6-90.314-2-600 MG TABS Take 1 tablet by mouth daily at 6 (six) AM. 90 tablet 1   metoprolol tartrate (LOPRESSOR) 25 MG tablet Take 25 mg by mouth daily.     Multiple Vitamin (MULTIVITAMIN) tablet Take 1 tablet by mouth daily.  OLMESARTAN MEDOXOMIL PO Take 5 mg by mouth.      Omega-3 Fatty Acids (FISH OIL) 1000 MG CAPS Take by mouth.     PITAVASTATIN MAGNESIUM PO Take by mouth.     tadalafil (CIALIS) 10 MG tablet Take 10 mg by mouth daily.     tamsulosin (FLOMAX) 0.4 MG CAPS capsule Take 0.4 mg by mouth daily.     temazepam (RESTORIL) 15 MG capsule Take 15 mg by mouth at bedtime as needed for sleep.     [START ON 11/08/2021] modafinil (PROVIGIL) 200 MG tablet Take 0.5 tablets (100 mg total) by mouth 2 (two) times daily. 90 tablet 1   No current facility-administered medications for this visit.    Medication Side  Effects: None  Allergies: No Known Allergies  Past Medical History:  Diagnosis Date   Alcohol dependence in remission (HCC)    Ankylosing spondylitis (HCC)    Depression    GERD (gastroesophageal reflux disease)    Hearing loss    Hypertension    Hypokalemia    Prediabetes    Rheumatoid arthritis ([place])    Visual impairment     Family History  Problem Relation Age of Onset   Alcohol abuse Other    Heart disease Other    Depression Other    Diabetes Mother    Hypertension Mother    Alcohol abuse Mother    Dementia Mother    Heart attack Father    Hypertension Father    Alcohol abuse Father    Alcohol abuse Brother    Suicidality Brother    Alcohol abuse Maternal Uncle    Alcohol abuse Maternal Grandfather     Social History   Socioeconomic History   Marital status: Married    Spouse name: Not on file   Number of children: Not on file   Years of education: Not on file   Highest education level: Not on file  Occupational History   Occupation: anesthesiologist  Tobacco Use   Smoking status: Former   Smokeless tobacco: Never  Substance and Sexual Activity   Alcohol use: Not Currently    Comment: Reports that last ETOH use was 10/16/15   Drug use: Not on file   Sexual activity: Not on file  Other Topics Concern   Not on file  Social History Narrative   Not on file   Social Determinants of Health   Financial Resource Strain: Not on file  Food Insecurity: Not on file  Transportation Needs: Not on file  Physical Activity: Not on file  Stress: Not on file  Social Connections: Not on file  Intimate Partner Violence: Not on file    Past Medical History, Surgical history, Social history, and Family history were reviewed and updated as appropriate.   Please see review of systems for further details on the patient's review from today.   Objective:   Physical Exam:  BP 120/60   Pulse (!) 55   Physical Exam Neurological:     Mental Status: He is alert  and oriented to person, place, and time.     Cranial Nerves: No dysarthria.  Psychiatric:        Attention and Perception: Attention and perception normal.        Mood and Affect: Mood normal.        Speech: Speech normal.        Behavior: Behavior is cooperative.        Thought Content: Thought content normal. Thought content is not  paranoid or delusional. Thought content does not include homicidal or suicidal ideation. Thought content does not include homicidal or suicidal plan.        Cognition and Memory: Cognition and memory normal.        Judgment: Judgment normal.     Comments: Insight intact     Lab Review:     Component Value Date/Time   NA 138 11/14/2006 0405   K 4.1 11/14/2006 0405   CL 103 11/14/2006 0405   CO2 28 11/14/2006 0405   GLUCOSE 106 (H) 11/14/2006 0405   BUN 13 11/14/2006 0405   CREATININE 1.04 11/14/2006 0405   CALCIUM 9.0 11/14/2006 0405   PROT 5.5 (L) 11/14/2006 0405   ALBUMIN 3.2 (L) 11/14/2006 0405   AST 67 (H) 11/14/2006 0405   ALT 67 (H) 11/14/2006 0405   ALKPHOS 48 11/14/2006 0405   BILITOT 0.6 11/14/2006 0405   GFRNONAA >60 11/14/2006 0405   GFRAA  11/14/2006 0405    >60        The eGFR has been calculated using the MDRD equation. This calculation has not been validated in all clinical       Component Value Date/Time   WBC 11.8 (H) 11/11/2006 1847   RBC 4.77 11/11/2006 1847   HGB 15.4 11/11/2006 1847   HCT 43.2 11/11/2006 1847   PLT 198 11/11/2006 1847   MCV 90.7 11/11/2006 1847   MCHC 35.5 11/11/2006 1847   RDW 15.2 (H) 11/11/2006 1847   LYMPHSABS 2.8 11/11/2006 1847   MONOABS 1.2 (H) 11/11/2006 1847   EOSABS 0.0 11/11/2006 1847   BASOSABS 0.0 11/11/2006 1847    No results found for: "POCLITH", "LITHIUM"   No results found for: "PHENYTOIN", "PHENOBARB", "VALPROATE", "CBMZ"   .res Assessment: Plan:   Pt seen for 30 minutes and time spent discussing medical foods and supplements to enhance concentration and cognition.  Discussed that Cerefolin is a medical food indicated to help with cognition and contains NAC, L-methylfolate, and Vitamin B 12. Discussed that NAC and L-methylfolate can be purchased OTC and a generic form of [place] is now available. He reports that he would like to re-start trial of Cerefolin through brand direct.  Will continue Modafinil 100 mg po BID for fatigue and mild difficulty with concentration secondary to Ankylosing Spondylitis.  Pt to follow-up in 6 months or sooner if clinically indicated.  Patient advised to contact office with any questions, adverse effects, or acute worsening in signs and symptoms.  George Higgins was seen today for follow-up.  Diagnoses and all orders for this visit:  Anxiety disorder, unspecified type  Fatigue, unspecified type Comments: Due to Ankylosing Spondylitis Orders: -     modafinil (PROVIGIL) 200 MG tablet; Take 0.5 tablets (100 mg total) by mouth 2 (two) times daily.  Other orders -     Methylfol-Algae-B12-Acetylcyst (CEREFOLIN NAC) 6-90.314-2-600 MG TABS; Take 1 tablet by mouth daily at 6 (six) AM.     Please see After Visit Summary for patient specific instructions.  No future appointments.  No orders of the defined types were placed in this encounter.     -------------------------------

## 2022-04-28 ENCOUNTER — Encounter: Payer: Self-pay | Admitting: Psychiatry

## 2022-04-28 ENCOUNTER — Telehealth (INDEPENDENT_AMBULATORY_CARE_PROVIDER_SITE_OTHER): Payer: Medicare Other | Admitting: Psychiatry

## 2022-04-28 DIAGNOSIS — F1021 Alcohol dependence, in remission: Secondary | ICD-10-CM | POA: Diagnosis not present

## 2022-04-28 DIAGNOSIS — R5383 Other fatigue: Secondary | ICD-10-CM | POA: Diagnosis not present

## 2022-04-28 MED ORDER — MODAFINIL 200 MG PO TABS: 100.0000 mg | ORAL_TABLET | Freq: Two times a day (BID) | ORAL | 0 refills | Status: DC

## 2022-04-28 NOTE — Progress Notes (Signed)
George Higgins [id number] 25-Jun-1955 67 y.o.  Virtual Visit via Video Note  I connected with pt @ on 04/28/22 at  3:45 PM EDT by a video enabled telemedicine application and verified that I am speaking with the correct person using two identifiers.   I discussed the limitations of evaluation and management by telemedicine and the availability of in person appointments. The patient expressed understanding and agreed to proceed.  I discussed the assessment and treatment plan with the patient. The patient was provided an opportunity to ask questions and all were answered. The patient agreed with the plan and demonstrated an understanding of the instructions.   The patient was advised to call back or seek an in-person evaluation if the symptoms worsen or if the condition fails to improve as anticipated.  I provided 30 minutes of non-face-to-face time during this encounter.  The patient was located in [place], [place].  The provider was located at home.   [name], PMHNP   Subjective:   Patient ID:  George Higgins. is a 67 y.o. (DOB 1956/02/16) male.  Chief Complaint:  Chief Complaint  Patient presents with   Follow-up    H/o fatigue, insomnia, anxiety, and ETOH dependence    HPI George Higgins. presents for follow-up of insomnia and fatigue. He reports that he has been working full-time. 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 He reports that he typically gets through work early. He has not been taking call, which is a change from the past.   He has been getting up at 5:30 am for work. He reports that he has insomnia and it can take 1-2 hours to fall asleep. He has to go to bed at 9:30 am. He reports that he is taking Temazepam prn per PCP and occ Benadryl prn. He reports some slight anxiety with work. "It hasn't been overwhelming." He denies "serious" depression. He reports that energy and motivation are "adequate...  could always be better." Denies impaired concentration. Appetite has been good. He reports some weight gain. Denies SI.   He is going to [place] for a month and has been planning the trip for he and several other people. 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 Daughter moved to [place], [place] a week ago.   Modafinil last filled 04/11/22.   He is not currently seeing a therapist. He occ goes to [facility]. Denies any ETOH use.   Review of Systems:  Review of Systems  Cardiovascular:  Negative for palpitations.       He reports that he experienced SVT when his PCP attempted dose reduction of Metoprolol. He reports that SVT resolved after resuming previous dose of Metoprolol. He was also seen by cardiology.   Musculoskeletal:  Negative for arthralgias, back pain and gait problem.  Psychiatric/Behavioral:         Please refer to HPI    Medications: I have reviewed the patient's current medications.  Current Outpatient Medications  Medication Sig Dispense Refill   metoprolol tartrate (LOPRESSOR) 25 MG tablet Take 25 mg by mouth daily.     [START ON 08/01/2022] modafinil (PROVIGIL) 200 MG tablet Take 0.5 tablets (100 mg total) by mouth 2 (two) times daily. 90 tablet 0   ZETIA 10 MG tablet Take 10 mg by mouth daily.     co-enzyme Q-10 30 MG capsule Take 30 mg by mouth 3 (three) times daily.     cyclobenzaprine (FLEXERIL)  10 MG tablet Take 10 mg by mouth as needed for muscle spasms.     famotidine (PEPCID) 40 MG tablet Take 40 mg by mouth daily.     Golimumab ([place] ARIA IV) Inject into the vein.     levothyroxine (SYNTHROID) 50 MCG tablet Take 50 mcg by mouth daily before breakfast.     Methylfol-Algae-B12-Acetylcyst (CEREFOLIN NAC) 6-90.314-2-600 MG TABS Take 1 tablet by mouth daily at 6 (six) AM. 90 tablet 1   modafinil (PROVIGIL) 200 MG tablet Take 0.5 tablets (100 mg total) by mouth 2 (two) times daily. 90 tablet 0   Multiple Vitamin (MULTIVITAMIN) tablet Take 1 tablet by mouth daily.     OLMESARTAN  MEDOXOMIL PO Take 5 mg by mouth.      Omega-3 Fatty Acids (FISH OIL) 1000 MG CAPS Take by mouth.     PITAVASTATIN MAGNESIUM PO Take by mouth.     tadalafil (CIALIS) 10 MG tablet Take 10 mg by mouth daily.     tamsulosin (FLOMAX) 0.4 MG CAPS capsule Take 0.4 mg by mouth daily.     temazepam (RESTORIL) 15 MG capsule Take 15 mg by mouth at bedtime as needed for sleep.     No current facility-administered medications for this visit.    Medication Side Effects: None  Allergies: No Known Allergies  Past Medical History:  Diagnosis Date   Alcohol dependence in remission (HCC)    Ankylosing spondylitis (HCC)    Depression    GERD (gastroesophageal reflux disease)    Hearing loss    Hypertension    Hypokalemia    Prediabetes    Rheumatoid arthritis ([place])    Visual impairment     Family History  Problem Relation Age of Onset   Alcohol abuse Other    Heart disease Other    Depression Other    Diabetes Mother    Hypertension Mother    Alcohol abuse Mother    Dementia Mother    Heart attack Father    Hypertension Father    Alcohol abuse Father    Alcohol abuse Brother    Suicidality Brother    Alcohol abuse Maternal Uncle    Alcohol abuse Maternal Grandfather     Social History   Socioeconomic History   Marital status: Married    Spouse name: Not on file   Number of children: Not on file   Years of education: Not on file   Highest education level: Not on file  Occupational History   Occupation: anesthesiologist  Tobacco Use   Smoking status: Former   Smokeless tobacco: Never  Substance and Sexual Activity   Alcohol use: Not Currently    Comment: Reports that last ETOH use was 10/16/15   Drug use: Not on file   Sexual activity: Not on file  Other Topics Concern   Not on file  Social History Narrative   Not on file   Social Determinants of Health   Financial Resource Strain: Not on file  Food Insecurity: Not on file  Transportation Needs: Not on file   Physical Activity: Not on file  Stress: Not on file  Social Connections: Not on file  Intimate Partner Violence: Not on file    Past Medical History, Surgical history, Social history, and Family history were reviewed and updated as appropriate.   Please see review of systems for further details on the patient's review from today.   Objective:   Physical Exam:  There were no vitals taken for this visit.  Physical Exam Neurological:     Mental Status: He is alert and oriented to person, place, and time.     Cranial Nerves: No dysarthria.  Psychiatric:        Attention and Perception: Attention and perception normal.        Mood and Affect: Mood normal.        Speech: Speech normal.        Behavior: Behavior is cooperative.        Thought Content: Thought content normal. Thought content is not paranoid or delusional. Thought content does not include homicidal or suicidal ideation. Thought content does not include homicidal or suicidal plan.        Cognition and Memory: Cognition and memory normal.        Judgment: Judgment normal.     Comments: Insight intact     Lab Review:     Component Value Date/Time   NA 138 11/14/2006 0405   K 4.1 11/14/2006 0405   CL 103 11/14/2006 0405   CO2 28 11/14/2006 0405   GLUCOSE 106 (H) 11/14/2006 0405   BUN 13 11/14/2006 0405   CREATININE 1.04 11/14/2006 0405   CALCIUM 9.0 11/14/2006 0405   PROT 5.5 (L) 11/14/2006 0405   ALBUMIN 3.2 (L) 11/14/2006 0405   AST 67 (H) 11/14/2006 0405   ALT 67 (H) 11/14/2006 0405   ALKPHOS 48 11/14/2006 0405   BILITOT 0.6 11/14/2006 0405   GFRNONAA >60 11/14/2006 0405   GFRAA  11/14/2006 0405    >60        The eGFR has been calculated using the MDRD equation. This calculation has not been validated in all clinical       Component Value Date/Time   WBC 11.8 (H) 11/11/2006 1847   RBC 4.77 11/11/2006 1847   HGB 15.4 11/11/2006 1847   HCT 43.2 11/11/2006 1847   PLT 198 11/11/2006 1847   MCV 90.7  11/11/2006 1847   MCHC 35.5 11/11/2006 1847   RDW 15.2 (H) 11/11/2006 1847   LYMPHSABS 2.8 11/11/2006 1847   MONOABS 1.2 (H) 11/11/2006 1847   EOSABS 0.0 11/11/2006 1847   BASOSABS 0.0 11/11/2006 1847    No results found for: "POCLITH", "LITHIUM"   No results found for: "PHENYTOIN", "PHENOBARB", "VALPROATE", "CBMZ"   .res Assessment: Plan:   Pt seen for 30 minutes and time spent discussing Modafinil. He reports that pharmacy dispensed a 90 day supply and then refilled it for 30 day supplies. Pt reports that he prefers a 90 day supply since he works out of town through the week. Will send in 2 separate script for Modafinil 90 day supplies, with the second script having a do not fill before date 12 weeks after the first script. Advised pt to contact office if he has difficulty filling script since he reports that he will be traveling out of the country for a month soon after his next fill date.  He reports that insomnia and anxiety symptoms are manageable at this time and therefore no additional medications are recommended at this time.  Pt to follow-up in 6 months or sooner if clinically indicated.  Patient advised to contact office with any questions, adverse effects, or acute worsening in signs and symptoms.   Kenlee was seen today for follow-up.  Diagnoses and all orders for this visit:  Alcohol dependence, in remission ([place])  Fatigue, unspecified type Comments: Due to Ankylosing Spondylitis Orders: -     modafinil (PROVIGIL) 200 MG tablet; Take 0.5  tablets (100 mg total) by mouth 2 (two) times daily. -     modafinil (PROVIGIL) 200 MG tablet; Take 0.5 tablets (100 mg total) by mouth 2 (two) times daily.     Please see After Visit Summary for patient specific instructions.  No future appointments.   No orders of the defined types were placed in this encounter.     -------------------------------

## 2022-05-07 NOTE — Telephone Encounter (Signed)
I called pt to make follow up visit.  He said that [employer] requires a doctor to request the 90 day script of Modafinil instead of a Designer[employer]ellery.  Next appt 9/5

## 2022-05-12 NOTE — Telephone Encounter (Signed)
Pt called back today at 3:57p asking about status.  He said he can't pick it up till [place].

## 2022-05-13 ENCOUNTER — Other Ambulatory Visit: Payer: Self-pay

## 2022-05-13 DIAGNOSIS — R5383 Other fatigue: Secondary | ICD-10-CM

## 2022-05-13 NOTE — Telephone Encounter (Signed)
Pt requesting 90-day script for Modafinil to be sent to [facility] in [place] since he will be traveling out of the country for an extended period. His pharmacy is requiring an MD signature for a 90-day supply of Modafinil. Please pend/send script from Dr. Clovis Pu. Modafinil last filled 04/11/22 for a 30 day supply.

## 2022-05-14 MED ORDER — MODAFINIL 200 MG PO TABS: 100.0000 mg | ORAL_TABLET | Freq: Two times a day (BID) | ORAL | 0 refills | Status: DC

## 2022-10-23 ENCOUNTER — Telehealth (INDEPENDENT_AMBULATORY_CARE_PROVIDER_SITE_OTHER): Payer: Medicare Other | Admitting: Psychiatry

## 2022-10-23 ENCOUNTER — Other Ambulatory Visit: Payer: Self-pay | Admitting: Psychiatry

## 2022-10-23 ENCOUNTER — Encounter: Payer: Self-pay | Admitting: Psychiatry

## 2022-10-23 VITALS — BP 127/71 | HR 55

## 2022-10-23 DIAGNOSIS — R5383 Other fatigue: Secondary | ICD-10-CM

## 2022-10-23 DIAGNOSIS — G4726 Circadian rhythm sleep disorder, shift work type: Secondary | ICD-10-CM

## 2022-10-23 MED ORDER — L-METHYLFOLATE 15 MG PO TABS: 15.0000 mg | ORAL_TABLET | Freq: Every day | ORAL | 3 refills | Status: AC

## 2022-10-23 MED ORDER — MODAFINIL 200 MG PO TABS: 100.0000 mg | ORAL_TABLET | Freq: Two times a day (BID) | ORAL | 0 refills | Status: AC

## 2022-10-23 NOTE — Progress Notes (Signed)
George Higgins [id number] 1955/12/18 67 y.o.  Virtual Visit via Video Note  I connected with pt @ on 10/24/22 at  4:00 PM EDT by a video enabled telemedicine application and verified that I am speaking with the correct person using two identifiers.   I discussed the limitations of evaluation and management by telemedicine and the availability of in person appointments. The patient expressed understanding and agreed to proceed.  I discussed the assessment and treatment plan with the patient. The patient was provided an opportunity to ask questions and all were answered. The patient agreed with the plan and demonstrated an understanding of the instructions.   The patient was advised to call back or seek an in-person evaluation if the symptoms worsen or if the condition fails to improve as anticipated.  I provided 34 minutes of non-face-to-face time during this encounter.  The patient was located at home in [place], [place].  The provider was located at [facility].   Corie Chiquito, PMHNP   Subjective:   Patient ID:  George Higgins. is a 67 y.o. (DOB 06-29-1955) male.  Chief Complaint:  Chief Complaint  Patient presents with   Fatigue    HPI George Higgins. presents for follow-up of insomnia and fatigue. He reports that he has been "doing pretty good." He reports that he has been sleeping ok most of the time. He worked at previous job for one year. He accepted a job in [place], [place]. He will be working week on and 2 weeks off. A week later he was offered a job in [place], where he lives. He is concerned about possibly "over-extending" himself. He reports that he has some more anxiety with working. Denies depressed mood. He reports, "energy could always be better." He reports some daytime somnolence, particularly while driving. He reports Modafinil is partially effective for daytime somnolence. Concentration is adequate. He reports some ST memory issues. He reports  that he compensates by taking care of things immediately or writing things down. Appetite has been "too good." He reports some weight gain. Denies SI.   Denies recent substance use.   He reports that trip to [place] went well.   Modafinil 08/11/22.   Review of Systems:  Review of Systems  Cardiovascular:  Negative for palpitations.  Musculoskeletal:  Negative for gait problem.  Neurological:  Negative for headaches.  Psychiatric/Behavioral:         Please refer to HPI    Medications: I have reviewed the patient's current medications.  Current Outpatient Medications  Medication Sig Dispense Refill   co-enzyme Q-10 30 MG capsule Take 30 mg by mouth 3 (three) times daily.     famotidine (PEPCID) 40 MG tablet Take 40 mg by mouth daily.     Golimumab ([place] ARIA IV) Inject into the vein.     L-Methylfolate 15 MG TABS Take 1 tablet (15 mg total) by mouth daily at 6 (six) AM. 90 tablet 3   levothyroxine (SYNTHROID) 50 MCG tablet Take 50 mcg by mouth daily before breakfast.     metoprolol tartrate (LOPRESSOR) 25 MG tablet Take 25 mg by mouth daily.     OLMESARTAN MEDOXOMIL PO Take 5 mg by mouth.      Omega-3 Fatty Acids (FISH OIL) 1000 MG CAPS Take by mouth.     PITAVASTATIN MAGNESIUM PO Take by mouth.     tadalafil (CIALIS) 10 MG tablet Take 10 mg by mouth daily.     tamsulosin (FLOMAX) 0.4 MG CAPS  capsule Take 0.4 mg by mouth daily.     temazepam (RESTORIL) 15 MG capsule Take 15 mg by mouth at bedtime as needed for sleep.     ZETIA 10 MG tablet Take 10 mg by mouth daily.     Methylfol-Algae-B12-Acetylcyst (CEREFOLIN NAC) 6-90.314-2-600 MG TABS Take 1 tablet by mouth daily at 6 (six) AM. (Patient not taking: Reported on 10/23/2022) 90 tablet 1   modafinil (PROVIGIL) 200 MG tablet Take 0.5 tablets (100 mg total) by mouth 2 (two) times daily. 90 tablet 0   No current facility-administered medications for this visit.    Medication Side Effects: None  Allergies: No Known Allergies  Past  Medical History:  Diagnosis Date   Alcohol dependence in remission (HCC)    Ankylosing spondylitis (HCC)    Depression    GERD (gastroesophageal reflux disease)    Hearing loss    Hypertension    Hypokalemia    Prediabetes    Rheumatoid arthritis (HCC)    Visual impairment     Family History  Problem Relation Age of Onset   Alcohol abuse Other    Heart disease Other    Depression Other    Diabetes Mother    Hypertension Mother    Alcohol abuse Mother    Dementia Mother    Heart attack Father    Hypertension Father    Alcohol abuse Father    Alcohol abuse Brother    Suicidality Brother    Alcohol abuse Maternal Uncle    Alcohol abuse Maternal Grandfather     Social History   Socioeconomic History   Marital status: Married    Spouse name: Not on file   Number of children: Not on file   Years of education: Not on file   Highest education level: Not on file  Occupational History   Occupation: anesthesiologist  Tobacco Use   Smoking status: Former   Smokeless tobacco: Never  Substance and Sexual Activity   Alcohol use: Not Currently    Comment: Reports that last ETOH use was 10/16/15   Drug use: Not on file   Sexual activity: Not on file  Other Topics Concern   Not on file  Social History Narrative   Not on file   Social Determinants of Health   Financial Resource Strain: Not on file  Food Insecurity: Not on file  Transportation Needs: Not on file  Physical Activity: Not on file  Stress: Not on file  Social Connections: Unknown (04/15/2022)   Received [facility]   Social Network    Social Network: Not on file  Intimate Partner Violence: Unknown (04/15/2022)   Received from [facility]   HITS    Physically Hurt: Not on file    Insult or Talk Down To: Not on file    Threaten Physical Harm: Not on file    Scream or Curse: Not on file    Past Medical History, Surgical history, Social history, and Family history  were reviewed and updated as appropriate.   Please see review of systems for further details on the patient's review from today.   Objective:   Physical Exam:  BP 127/71   Pulse (!) 55   Physical Exam Neurological:     Mental Status: He is alert and oriented to person, place, and time.     Cranial Nerves: No dysarthria.  Psychiatric:        Attention and Perception: Attention and perception normal.  Mood and Affect: Mood normal.        Speech: Speech normal.        Behavior: Behavior is cooperative.        Thought Content: Thought content normal. Thought content is not paranoid or delusional. Thought content does not include homicidal or suicidal ideation. Thought content does not include homicidal or suicidal plan.        Cognition and Memory: Cognition and memory normal.        Judgment: Judgment normal.     Comments: Insight intact     Lab Review:     Component Value Date/Time   NA 138 11/14/2006 0405   K 4.1 11/14/2006 0405   CL 103 11/14/2006 0405   CO2 28 11/14/2006 0405   GLUCOSE 106 (H) 11/14/2006 0405   BUN 13 11/14/2006 0405   CREATININE 1.04 11/14/2006 0405   CALCIUM 9.0 11/14/2006 0405   PROT 5.5 (L) 11/14/2006 0405   ALBUMIN 3.2 (L) 11/14/2006 0405   AST 67 (H) 11/14/2006 0405   ALT 67 (H) 11/14/2006 0405   ALKPHOS 48 11/14/2006 0405   BILITOT 0.6 11/14/2006 0405   GFRNONAA >60 11/14/2006 0405   GFRAA  11/14/2006 0405    >60        The eGFR has been calculated using the MDRD equation. This calculation has not been validated in all clinical       Component Value Date/Time   WBC 11.8 (H) 11/11/2006 1847   RBC 4.77 11/11/2006 1847   HGB 15.4 11/11/2006 1847   HCT 43.2 11/11/2006 1847   PLT 198 11/11/2006 1847   MCV 90.7 11/11/2006 1847   MCHC 35.5 11/11/2006 1847   RDW 15.2 (H) 11/11/2006 1847   LYMPHSABS 2.8 11/11/2006 1847   MONOABS 1.2 (H) 11/11/2006 1847   EOSABS 0.0 11/11/2006 1847   BASOSABS 0.0 11/11/2006 1847    No results  found for: "POCLITH", "LITHIUM"   No results found for: "PHENYTOIN", "PHENOBARB", "VALPROATE", "CBMZ"   .res Assessment: Plan:   Pt reports that he has not taken Cerefolin recently. Discussed that it may be more cost-effective to take different components of Cerefolin separately with prescription generic l-methylfolate and obtaining N-Acetyl-Cysteine (NAC) 600 mg BID over-the -counter. Pt is in agreement with this plan. Script sent to [facility] for generic L-Methylfolate 15 mg daily.  Discussed Armodafinil and Sunosi since pt reports that another provider mentioned newer alternatives to Modafinil. Pt reports that he would like to continue with Modafinil since this has been effective and well-tolerated. He reports that he may wish to consider Sunosi if Modafinil is not adequately controlling symptoms when he starts new jobs with longer hours and working nights.  He prefers 90-day script of Modafinil due to periodically traveling with work. He reports that his pharmacy has required Dr. Alwyn Ren authorization for 90-day supplies in the past. Will pend script for Dr. Jennelle Human.  Pt to follow-up in 6 months or sooner if clinically indicated.  Patient advised to contact office with any questions, adverse effects, or acute worsening in signs and symptoms.   Tarion was seen today for fatigue.  Diagnoses and all orders for this visit:  Shift work sleep disorder  Fatigue, unspecified type Comments: Due to Ankylosing Spondylitis Orders: -     L-Methylfolate 15 MG TABS; Take 1 tablet (15 mg total) by mouth daily at 6 (six) AM.     Please see After Visit Summary for patient specific instructions.  No future appointments.  No orders of  the defined types were placed in this encounter.     -------------------------------

## 2022-12-31 ENCOUNTER — Encounter: Payer: Self-pay | Admitting: Psychiatry
